# Patient Record
Sex: Female | Born: 1990 | Race: Black or African American | Hispanic: No | Marital: Single | Smoking: Former smoker
Health system: Southern US, Community
[De-identification: ages and names within clinical notes are randomized; demographics above are authoritative.]

## PROBLEM LIST (undated history)

## (undated) DIAGNOSIS — O139 Gestational [pregnancy-induced] hypertension without significant proteinuria, unspecified trimester: Secondary | ICD-10-CM

## (undated) DIAGNOSIS — I10 Essential (primary) hypertension: Secondary | ICD-10-CM

## (undated) HISTORY — PX: NO PAST SURGERIES: SHX2092

## (undated) HISTORY — PX: OTHER SURGICAL HISTORY: SHX169

---
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
## 2011-01-26 LAB — POCT PREGNANCY, URINE: Preg Test, Ur: NEGATIVE

## 2011-05-31 IMAGING — US US OB DETAIL+14 WK
2 of 3 series · 14 of 28 positions shown · non-contrast
Comparison: none

OBSTETRICAL ULTRASOUND:
 This ultrasound exam was performed in the [HOSPITAL] Ultrasound Department.  The OB US report was generated in the AS system, and faxed to the ordering physician.  This report is also available in [REDACTED] PACS.

[Series 1: us ob detail +14 wk · 0.20mm/px · 77 acquisitions, 13 frames shown (1 of 2)]
[im 4/77]
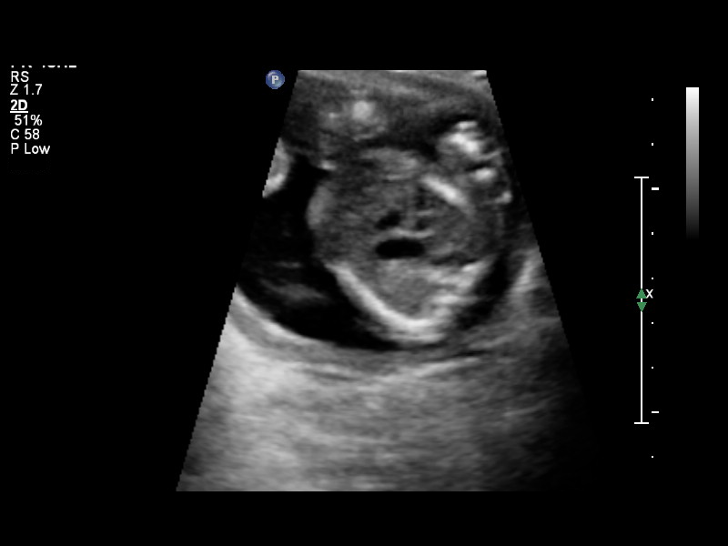
[im 10/77]
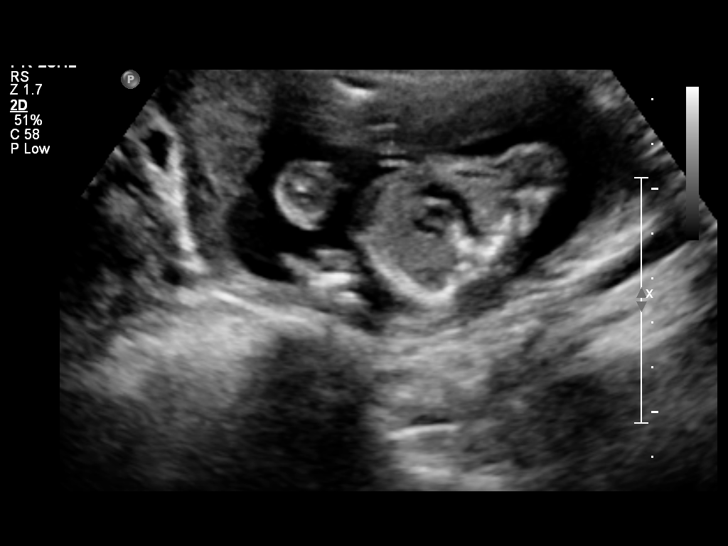
[im 16/77]
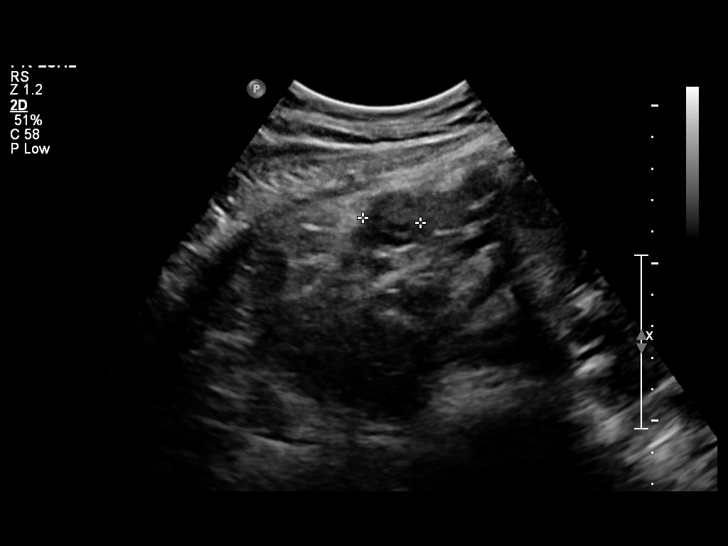
[im 22/77]
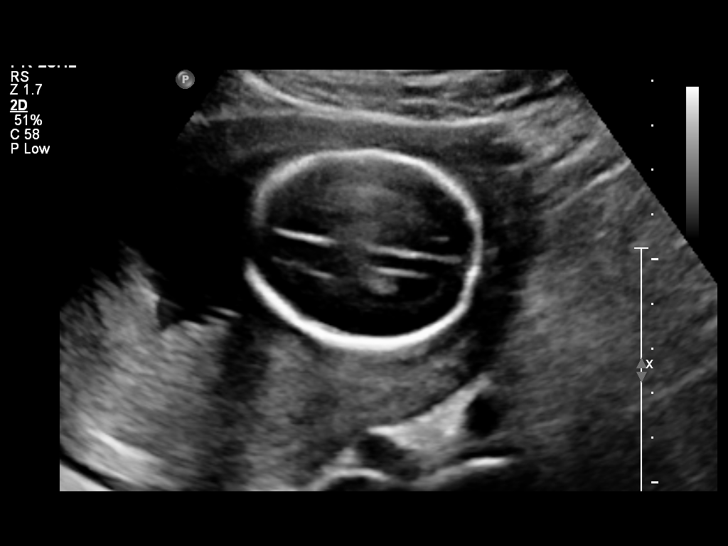
[im 28/77]
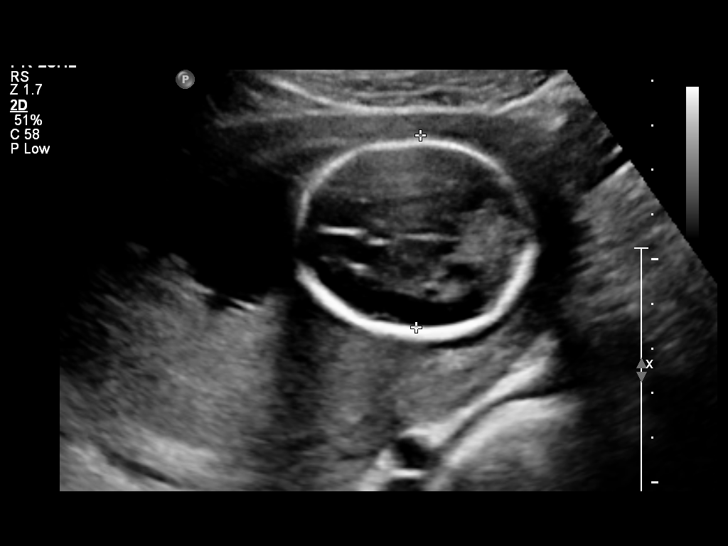
[im 34/77]
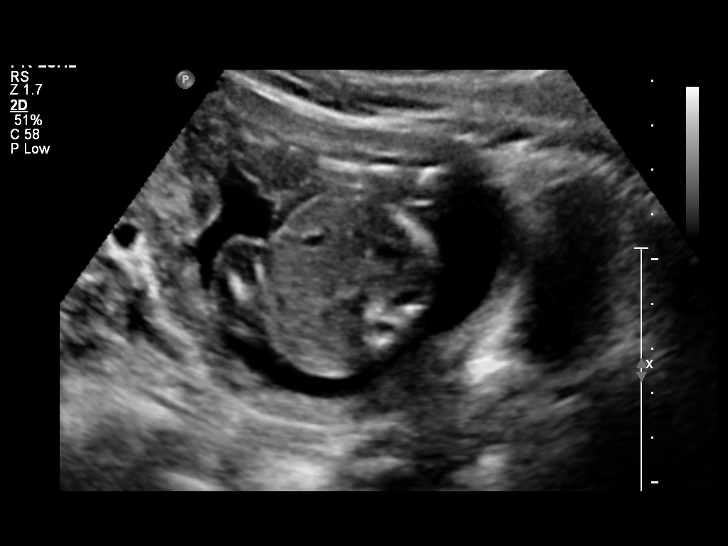
[im 40/77]
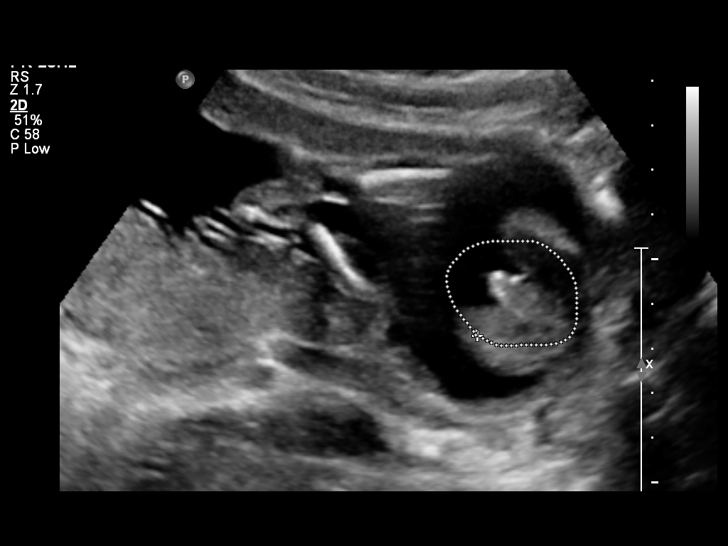
[im 46/77]
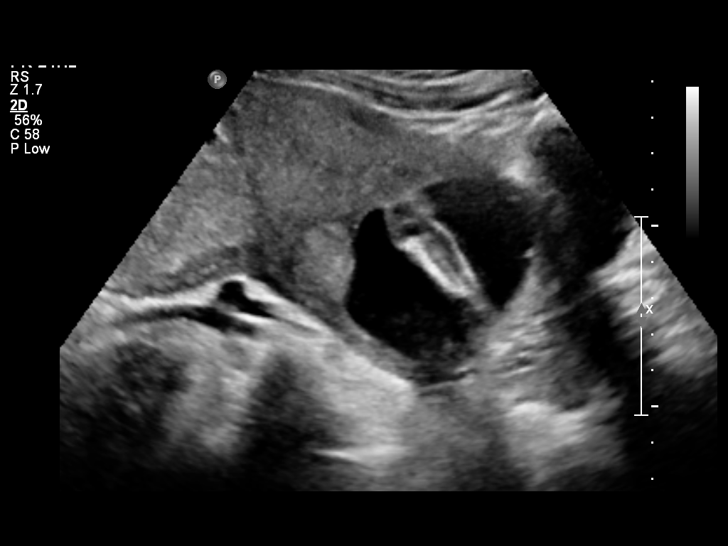
[im 52/77]
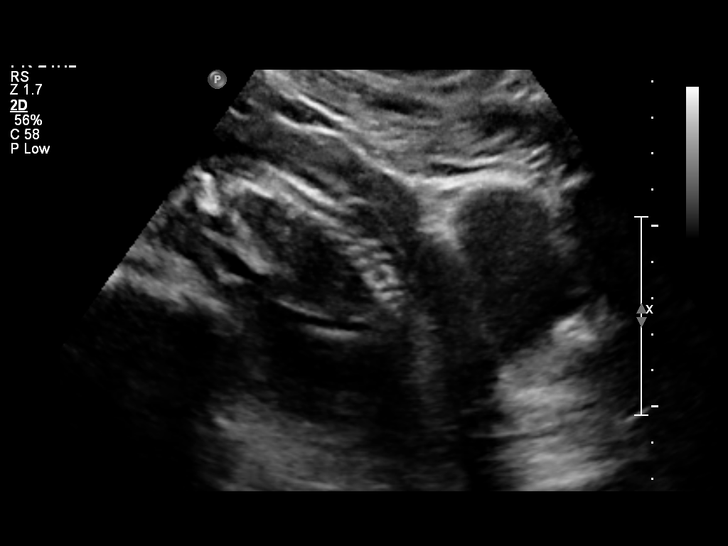
[im 58/77]
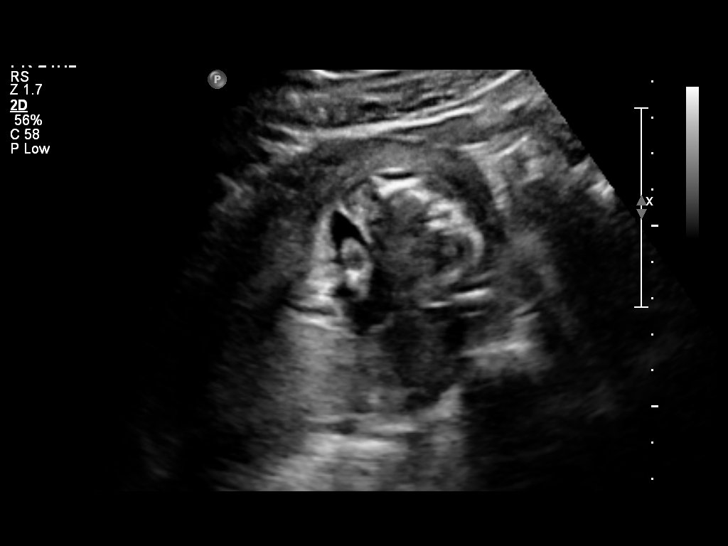
[im 64/77]
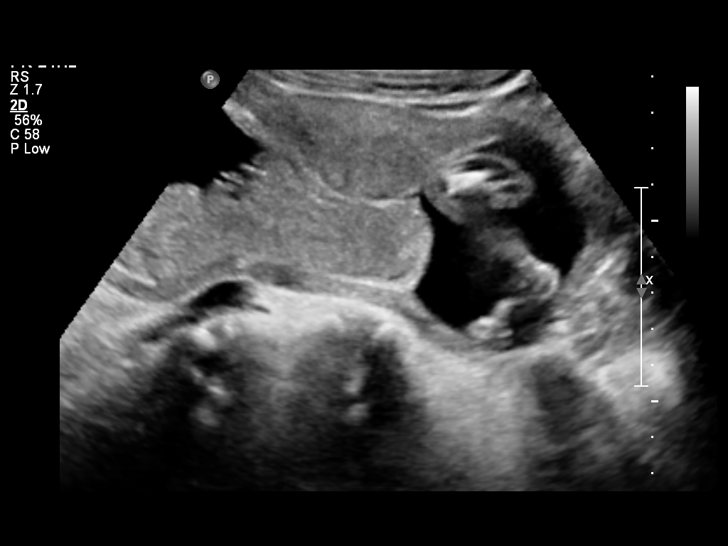
[im 70/77]
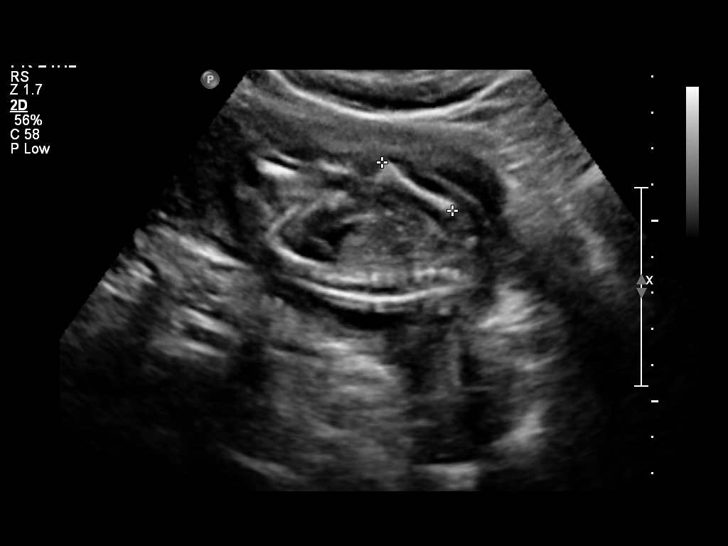
[im 77/77]
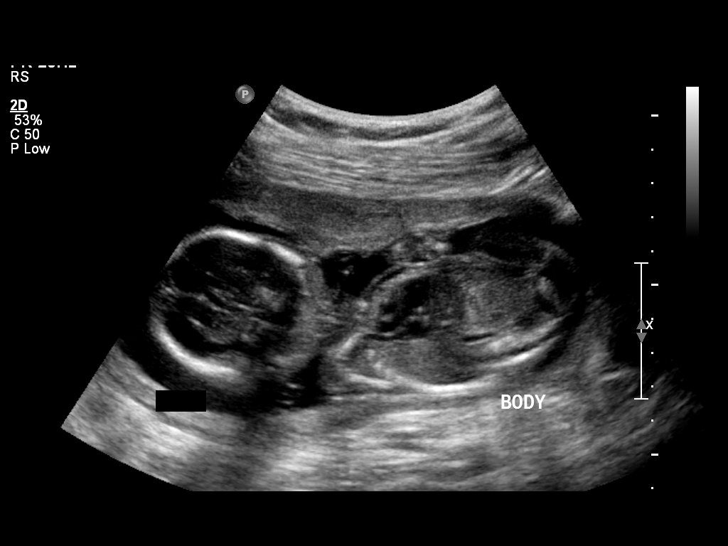

[Series 1: us ob detail +14 wk · 0.17mm/px · 1 of 4 slices shown (2 of 2)]
[im 1/4]
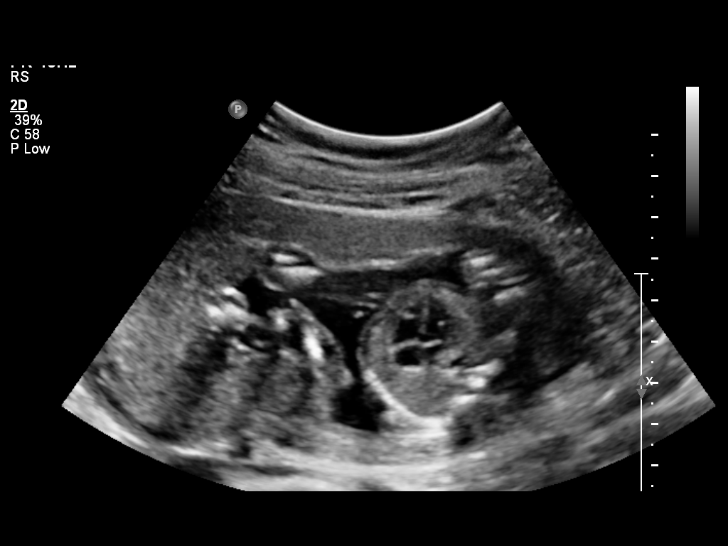

[14 of 28 positions shown; findings below may reference images not displayed]

IMPRESSION: See AS Obstetric US report.

## 2012-01-12 ENCOUNTER — Encounter (HOSPITAL_COMMUNITY): Payer: Self-pay | Admitting: Emergency Medicine

## 2012-01-12 ENCOUNTER — Emergency Department (HOSPITAL_COMMUNITY)
Admission: EM | Admit: 2012-01-12 | Discharge: 2012-01-13 | Disposition: A | Discharge status: Home / Self Care | Payer: No Typology Code available for payment source | Attending: Emergency Medicine | Admitting: Emergency Medicine

## 2012-01-12 DIAGNOSIS — Y92410 Unspecified street and highway as the place of occurrence of the external cause: Secondary | ICD-10-CM | POA: Insufficient documentation

## 2012-01-12 DIAGNOSIS — IMO0002 Reserved for concepts with insufficient information to code with codable children: Secondary | ICD-10-CM

## 2012-01-12 DIAGNOSIS — Z23 Encounter for immunization: Secondary | ICD-10-CM | POA: Insufficient documentation

## 2012-01-12 DIAGNOSIS — M542 Cervicalgia: Secondary | ICD-10-CM | POA: Insufficient documentation

## 2012-01-12 DIAGNOSIS — R079 Chest pain, unspecified: Secondary | ICD-10-CM | POA: Insufficient documentation

## 2012-01-12 DIAGNOSIS — S81009A Unspecified open wound, unspecified knee, initial encounter: Secondary | ICD-10-CM | POA: Insufficient documentation

## 2012-01-12 HISTORY — DX: Essential (primary) hypertension: I10
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
## 2012-01-13 ENCOUNTER — Emergency Department (HOSPITAL_COMMUNITY): Payer: No Typology Code available for payment source

## 2012-01-13 MED ORDER — IBUPROFEN 800 MG PO TABS
800.0000 mg | ORAL_TABLET | Freq: Once | ORAL | Status: AC
  Administered 2012-01-13: 800 mg via ORAL
  Filled 2012-01-13: qty 1

## 2012-01-13 MED ORDER — OXYCODONE-ACETAMINOPHEN 5-325 MG PO TABS: 1.0000 | ORAL_TABLET | Freq: Four times a day (QID) | ORAL | Status: DC | PRN

## 2012-01-13 MED ORDER — TETANUS-DIPHTHERIA TOXOIDS TD 5-2 LFU IM INJ
0.5000 mL | INJECTION | Freq: Once | INTRAMUSCULAR | Status: AC
  Administered 2012-01-13: 0.5 mL via INTRAMUSCULAR
  Filled 2012-01-13: qty 0.5

## 2012-01-13 MED ORDER — OXYCODONE-ACETAMINOPHEN 5-325 MG PO TABS
2.0000 | ORAL_TABLET | Freq: Once | ORAL | Status: AC
  Administered 2012-01-13: 2 via ORAL
  Filled 2012-01-13: qty 2

## 2012-01-13 MED ORDER — DIAZEPAM 5 MG PO TABS: 5.0000 mg | ORAL_TABLET | Freq: Two times a day (BID) | ORAL | Status: DC
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
## 2012-01-13 NOTE — ED Provider Notes (Signed)
Medical screening examination/treatment/procedure(s) were conducted as a shared visit with non-physician practitioner(s) and myself.  I personally evaluated the patient during the encounter.  Laceration repair and discharge completed by Darius Bump, MD 01/13/12 [phone]

## 2012-01-16 ENCOUNTER — Emergency Department (HOSPITAL_COMMUNITY)
Admission: EM | Admit: 2012-01-16 | Discharge: 2012-01-16 | Disposition: A | Discharge status: Home / Self Care | Payer: No Typology Code available for payment source | Attending: Emergency Medicine | Admitting: Emergency Medicine

## 2012-01-16 ENCOUNTER — Encounter (HOSPITAL_COMMUNITY): Payer: Self-pay | Admitting: Adult Health

## 2012-01-16 DIAGNOSIS — Z87891 Personal history of nicotine dependence: Secondary | ICD-10-CM | POA: Insufficient documentation

## 2012-01-16 DIAGNOSIS — X58XXXA Exposure to other specified factors, initial encounter: Secondary | ICD-10-CM | POA: Insufficient documentation

## 2012-01-16 DIAGNOSIS — T148XXA Other injury of unspecified body region, initial encounter: Secondary | ICD-10-CM | POA: Insufficient documentation

## 2012-01-16 DIAGNOSIS — L089 Local infection of the skin and subcutaneous tissue, unspecified: Secondary | ICD-10-CM | POA: Insufficient documentation

## 2012-01-16 DIAGNOSIS — I10 Essential (primary) hypertension: Secondary | ICD-10-CM | POA: Insufficient documentation

## 2012-01-16 MED ORDER — CEPHALEXIN 500 MG PO CAPS: 500.0000 mg | ORAL_CAPSULE | Freq: Four times a day (QID) | ORAL | Status: DC
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
## 2012-01-16 NOTE — ED Notes (Signed)
Stitches in left lower leg with serous sanguinous drainage.  Pt concerned wound is infected, no redness around site.

## 2012-01-17 NOTE — ED Provider Notes (Signed)
Medical screening examination/treatment/procedure(s) were performed by non-physician practitioner and as supervising physician I was immediately available for consultation/collaboration.   Nannie Starzyk, MD 01/17/12 0040 

## 2012-07-29 ENCOUNTER — Inpatient Hospital Stay (HOSPITAL_COMMUNITY): Payer: Medicaid Other | Admitting: Anesthesiology

## 2012-07-29 ENCOUNTER — Encounter (HOSPITAL_COMMUNITY): Payer: Self-pay | Admitting: Anesthesiology

## 2012-07-29 ENCOUNTER — Inpatient Hospital Stay (HOSPITAL_COMMUNITY): Payer: Medicaid Other

## 2012-07-29 ENCOUNTER — Inpatient Hospital Stay (HOSPITAL_COMMUNITY)
Admission: AD | Admit: 2012-07-29 | Discharge: 2012-08-01 | DRG: 765 | Disposition: A | Discharge status: Home / Self Care | Payer: Medicaid Other | Source: Ambulatory Visit | Attending: Obstetrics and Gynecology | Admitting: Obstetrics and Gynecology

## 2012-07-29 ENCOUNTER — Encounter (HOSPITAL_COMMUNITY): Payer: Self-pay

## 2012-07-29 ENCOUNTER — Encounter (HOSPITAL_COMMUNITY)
Admission: AD | Disposition: A | Discharge status: Home / Self Care | Payer: Self-pay | Source: Ambulatory Visit | Attending: Obstetrics and Gynecology

## 2012-07-29 DIAGNOSIS — O09299 Supervision of pregnancy with other poor reproductive or obstetric history, unspecified trimester: Secondary | ICD-10-CM

## 2012-07-29 DIAGNOSIS — O4590 Premature separation of placenta, unspecified, unspecified trimester: Secondary | ICD-10-CM

## 2012-07-29 DIAGNOSIS — O368390 Maternal care for abnormalities of the fetal heart rate or rhythm, unspecified trimester, not applicable or unspecified: Secondary | ICD-10-CM

## 2012-07-29 DIAGNOSIS — O0930 Supervision of pregnancy with insufficient antenatal care, unspecified trimester: Secondary | ICD-10-CM

## 2012-07-29 DIAGNOSIS — Z98891 History of uterine scar from previous surgery: Secondary | ICD-10-CM

## 2012-07-29 HISTORY — DX: Gestational (pregnancy-induced) hypertension without significant proteinuria, unspecified trimester: O13.9

## 2012-07-29 LAB — CBC
HCT: 29.9 % — ABNORMAL LOW (ref 36.0–46.0)
Hemoglobin: 10.3 g/dL — ABNORMAL LOW (ref 12.0–15.0)
MCHC: 34.4 g/dL (ref 30.0–36.0)

## 2012-07-29 SURGERY — Surgical Case
Anesthesia: General | Site: Abdomen | Wound class: Clean Contaminated

## 2012-07-29 MED ORDER — CEFAZOLIN SODIUM-DEXTROSE 2-3 GM-% IV SOLR
2.0000 g | Freq: Three times a day (TID) | INTRAVENOUS | Status: AC
  Administered 2012-07-29: 2 g via INTRAVENOUS
  Filled 2012-07-29: qty 50

## 2012-07-29 MED ORDER — BETAMETHASONE SOD PHOS & ACET 6 (3-3) MG/ML IJ SUSP
12.0000 mg | Freq: Once | INTRAMUSCULAR | Status: AC
  Administered 2012-07-29: 12 mg via INTRAMUSCULAR
  Filled 2012-07-29: qty 2

## 2012-07-29 SURGICAL SUPPLY — 27 items
CONTAINER PREFILL 10% NBF 15ML (MISCELLANEOUS) IMPLANT
DRAPE LG THREE QUARTER DISP (DRAPES) ×2 IMPLANT
DRSG OPSITE 6X11 MED (GAUZE/BANDAGES/DRESSINGS) ×2 IMPLANT
DRSG OPSITE POSTOP 4X10 (GAUZE/BANDAGES/DRESSINGS) ×2 IMPLANT
DURAPREP 26ML APPLICATOR (WOUND CARE) ×2 IMPLANT
ELECT REM PT RETURN 9FT ADLT (ELECTROSURGICAL) ×2
ELECTRODE REM PT RTRN 9FT ADLT (ELECTROSURGICAL) ×1 IMPLANT
EXTRACTOR VACUUM M CUP 4 TUBE (SUCTIONS) IMPLANT
GLOVE BIOGEL PI IND STRL 6.5 (GLOVE) ×1 IMPLANT
GLOVE BIOGEL PI INDICATOR 6.5 (GLOVE) ×1
GLOVE SURG SS PI 6.0 STRL IVOR (GLOVE) ×2 IMPLANT
GOWN STRL REIN XL XLG (GOWN DISPOSABLE) ×4 IMPLANT
KIT ABG SYR 3ML LUER SLIP (SYRINGE) IMPLANT
NEEDLE HYPO 25X5/8 SAFETYGLIDE (NEEDLE) IMPLANT
NS IRRIG 1000ML POUR BTL (IV SOLUTION) ×2 IMPLANT
PACK C SECTION WH (CUSTOM PROCEDURE TRAY) ×2 IMPLANT
PAD OB MATERNITY 4.3X12.25 (PERSONAL CARE ITEMS) ×2 IMPLANT
RTRCTR C-SECT PINK 25CM LRG (MISCELLANEOUS) IMPLANT
SEPRAFILM MEMBRANE 5X6 (MISCELLANEOUS) IMPLANT
SLEEVE SCD COMPRESS KNEE MED (MISCELLANEOUS) IMPLANT
STAPLER VISISTAT 35W (STAPLE) ×2 IMPLANT
SUT PLAIN 0 NONE (SUTURE) IMPLANT
SUT VIC AB 0 CT1 36 (SUTURE) ×8 IMPLANT
SUT VIC AB 4-0 KS 27 (SUTURE) IMPLANT
TOWEL OR 17X24 6PK STRL BLUE (TOWEL DISPOSABLE) ×6 IMPLANT
TRAY FOLEY CATH 14FR (SET/KITS/TRAYS/PACK) ×2 IMPLANT
WATER STERILE IRR 1000ML POUR (IV SOLUTION) ×2 IMPLANT

## 2012-07-29 NOTE — MAU Provider Note (Signed)
Pt brought to OR. Unable to find fetal heart tones. BMZ given while on OR table.

## 2012-07-30 ENCOUNTER — Encounter (HOSPITAL_COMMUNITY): Payer: Self-pay | Admitting: *Deleted

## 2012-07-30 LAB — RAPID URINE DRUG SCREEN, HOSP PERFORMED
Benzodiazepines: POSITIVE — AB
Cocaine: NOT DETECTED

## 2012-07-30 LAB — HEPATITIS B SURFACE ANTIGEN: Hepatitis B Surface Ag: NEGATIVE

## 2012-07-30 LAB — CBC
Hemoglobin: 8.6 g/dL — ABNORMAL LOW (ref 12.0–15.0)
MCHC: 34.1 g/dL (ref 30.0–36.0)
RDW: 13.8 % (ref 11.5–15.5)
WBC: 27.6 10*3/uL — ABNORMAL HIGH (ref 4.0–10.5)

## 2012-07-30 LAB — TYPE AND SCREEN
ABO/RH(D): A POS
Antibody Screen: NEGATIVE

## 2012-07-30 LAB — ABO/RH: ABO/RH(D): A POS

## 2012-07-30 LAB — RAPID HIV SCREEN (WH-MAU): Rapid HIV Screen: NONREACTIVE

## 2012-07-30 MED ORDER — SIMETHICONE 80 MG PO CHEW: 80.0000 mg | CHEWABLE_TABLET | ORAL | Status: DC | PRN

## 2012-07-30 MED ORDER — HYDROMORPHONE HCL PF 1 MG/ML IJ SOLN
0.2500 mg | INTRAMUSCULAR | Status: DC | PRN
  Administered 2012-07-30 (×2): 0.5 mg via INTRAVENOUS

## 2012-07-30 MED ORDER — MENTHOL 3 MG MT LOZG: 1.0000 | LOZENGE | OROMUCOSAL | Status: DC | PRN

## 2012-07-30 MED ORDER — SODIUM CHLORIDE 0.9 % IJ SOLN: 9.0000 mL | INTRAMUSCULAR | Status: DC | PRN

## 2012-07-30 MED ORDER — FENTANYL CITRATE 0.05 MG/ML IJ SOLN
INTRAMUSCULAR | Status: DC | PRN
  Administered 2012-07-30: 250 ug via INTRAVENOUS
  Administered 2012-07-30: 100 ug via INTRAVENOUS

## 2012-07-30 MED ORDER — LACTATED RINGERS IV SOLN: INTRAVENOUS | Status: DC

## 2012-07-30 MED ORDER — DIBUCAINE 1 % RE OINT: 1.0000 "application " | TOPICAL_OINTMENT | RECTAL | Status: DC | PRN

## 2012-07-30 MED ORDER — LACTATED RINGERS IV SOLN
INTRAVENOUS | Status: DC | PRN
  Administered 2012-07-29: via INTRAVENOUS

## 2012-07-30 MED ORDER — SUCCINYLCHOLINE CHLORIDE 20 MG/ML IJ SOLN
INTRAMUSCULAR | Status: DC | PRN
  Administered 2012-07-29: 120 mg via INTRAVENOUS

## 2012-07-30 MED ORDER — WITCH HAZEL-GLYCERIN EX PADS: 1.0000 "application " | MEDICATED_PAD | CUTANEOUS | Status: DC | PRN

## 2012-07-30 MED ORDER — LANOLIN HYDROUS EX OINT: 1.0000 "application " | TOPICAL_OINTMENT | CUTANEOUS | Status: DC | PRN

## 2012-07-30 MED ORDER — METOCLOPRAMIDE HCL 5 MG/ML IJ SOLN
INTRAMUSCULAR | Status: DC | PRN
  Administered 2012-07-30: 10 mg via INTRAVENOUS

## 2012-07-30 MED ORDER — ONDANSETRON HCL 4 MG/2ML IJ SOLN: 4.0000 mg | Freq: Four times a day (QID) | INTRAMUSCULAR | Status: DC | PRN

## 2012-07-30 MED ORDER — TETANUS-DIPHTH-ACELL PERTUSSIS 5-2.5-18.5 LF-MCG/0.5 IM SUSP
0.5000 mL | Freq: Once | INTRAMUSCULAR | Status: AC
  Administered 2012-07-31: 0.5 mL via INTRAMUSCULAR
  Filled 2012-07-30: qty 0.5

## 2012-07-30 MED ORDER — METOCLOPRAMIDE HCL 5 MG/ML IJ SOLN: 10.0000 mg | Freq: Once | INTRAMUSCULAR | Status: DC | PRN

## 2012-07-30 MED ORDER — OXYTOCIN 10 UNIT/ML IJ SOLN
40.0000 [IU] | INTRAVENOUS | Status: DC | PRN
  Administered 2012-07-29: 40 [IU] via INTRAVENOUS

## 2012-07-30 MED ORDER — PROPOFOL 10 MG/ML IV BOLUS
INTRAVENOUS | Status: DC | PRN
  Administered 2012-07-29: 200 mg via INTRAVENOUS

## 2012-07-30 MED ORDER — OXYTOCIN 40 UNITS IN LACTATED RINGERS INFUSION - SIMPLE MED: 62.5000 mL/h | INTRAVENOUS | Status: AC

## 2012-07-30 MED ORDER — FERROUS SULFATE 325 (65 FE) MG PO TABS
325.0000 mg | ORAL_TABLET | Freq: Two times a day (BID) | ORAL | Status: DC
  Administered 2012-07-30 – 2012-08-01 (×5): 325 mg via ORAL
  Filled 2012-07-30 (×5): qty 1

## 2012-07-30 MED ORDER — NALOXONE HCL 0.4 MG/ML IJ SOLN: 0.4000 mg | INTRAMUSCULAR | Status: DC | PRN

## 2012-07-30 MED ORDER — KETOROLAC TROMETHAMINE 30 MG/ML IJ SOLN
15.0000 mg | Freq: Once | INTRAMUSCULAR | Status: AC | PRN
  Administered 2012-07-30: 30 mg via INTRAVENOUS

## 2012-07-30 MED ORDER — PRENATAL MULTIVITAMIN CH
1.0000 | ORAL_TABLET | Freq: Every day | ORAL | Status: DC
  Administered 2012-07-30 – 2012-08-01 (×3): 1 via ORAL
  Filled 2012-07-30 (×3): qty 1

## 2012-07-30 MED ORDER — DIPHENHYDRAMINE HCL 12.5 MG/5ML PO ELIX: 12.5000 mg | ORAL_SOLUTION | Freq: Four times a day (QID) | ORAL | Status: DC | PRN

## 2012-07-30 MED ORDER — DIPHENHYDRAMINE HCL 50 MG/ML IJ SOLN: 12.5000 mg | Freq: Four times a day (QID) | INTRAMUSCULAR | Status: DC | PRN

## 2012-07-30 MED ORDER — OXYCODONE-ACETAMINOPHEN 5-325 MG PO TABS
1.0000 | ORAL_TABLET | ORAL | Status: DC | PRN
  Administered 2012-07-30: 1 via ORAL
  Administered 2012-07-31 (×2): 2 via ORAL
  Filled 2012-07-30: qty 2
  Filled 2012-07-30: qty 1
  Filled 2012-07-30: qty 2

## 2012-07-30 MED ORDER — HYDROMORPHONE 0.3 MG/ML IV SOLN
INTRAVENOUS | Status: DC
  Administered 2012-07-30: 03:00:00 via INTRAVENOUS
  Administered 2012-07-30: 2.7 mg via INTRAVENOUS
  Filled 2012-07-30: qty 25

## 2012-07-30 MED ORDER — SENNOSIDES-DOCUSATE SODIUM 8.6-50 MG PO TABS
2.0000 | ORAL_TABLET | Freq: Every day | ORAL | Status: DC
  Administered 2012-07-30 – 2012-07-31 (×2): 2 via ORAL

## 2012-07-30 MED ORDER — PROPOFOL 10 MG/ML IV BOLUS: INTRAVENOUS | Status: DC | PRN

## 2012-07-30 MED ORDER — SIMETHICONE 80 MG PO CHEW
80.0000 mg | CHEWABLE_TABLET | Freq: Three times a day (TID) | ORAL | Status: DC
  Administered 2012-07-30 – 2012-08-01 (×9): 80 mg via ORAL

## 2012-07-30 MED ORDER — MIDAZOLAM HCL 5 MG/5ML IJ SOLN
INTRAMUSCULAR | Status: DC | PRN
  Administered 2012-07-30: 2 mg via INTRAVENOUS

## 2012-07-30 MED ORDER — DEXAMETHASONE SODIUM PHOSPHATE 4 MG/ML IJ SOLN
INTRAMUSCULAR | Status: DC | PRN
  Administered 2012-07-30: 10 mg via INTRAVENOUS

## 2012-07-30 MED ORDER — DIPHENHYDRAMINE HCL 25 MG PO CAPS: 25.0000 mg | ORAL_CAPSULE | Freq: Four times a day (QID) | ORAL | Status: DC | PRN

## 2012-07-30 MED ORDER — LACTATED RINGERS IV SOLN
INTRAVENOUS | Status: DC | PRN
  Administered 2012-07-29 – 2012-07-30 (×3): via INTRAVENOUS

## 2012-07-30 MED ORDER — ONDANSETRON HCL 4 MG/2ML IJ SOLN
INTRAMUSCULAR | Status: DC | PRN
  Administered 2012-07-30: 4 mg via INTRAVENOUS

## 2012-07-30 NOTE — Progress Notes (Signed)
Subjective: Postpartum Day 1: Cesarean Delivery Patient reports pain is well controlled on PCA. Ambulating. Foley in place  Objective: Vital signs in last 24 hours: Temp:  [97.7 F (36.5 C)-98.6 F (37 C)] 98.1 F (36.7 C) (04/09 0530) Pulse Rate:  [84-109] 84 (04/09 0530) Resp:  [16-22] 18 (04/09 0542) BP: (114-148)/(72-93) 116/74 mmHg (04/09 0530) SpO2:  [95 %-100 %] 100 % (04/09 0542) Weight:  [99.791 kg (220 lb)] 99.791 kg (220 lb) (04/09 0300)  Physical Exam:  General: alert, cooperative and appears stated age Lochia: appropriate Uterine Fundus: firm Incision: no significant drainage DVT Evaluation: No evidence of DVT seen on physical exam.   Recent Labs  07/29/12 2337 07/30/12 0545  HGB 10.3* 8.6*  HCT 29.9* 25.2*    Assessment/Plan: Status post Cesarean section. Doing well postoperatively.  Continue current care. - Anemia at baseline worsened secondary to blood loss from C-section. Starting FeSO4 - DC PCA today and transition to PO Percocet - Foley out today  - Plans to pump breast milk - Undecided on contraception - Baby in NICU - social work consult - UDS  MERRELL, DAVID, MD Family Medicine PGY2 07/30/2012, 7:25 AM

## 2012-07-30 NOTE — Anesthesia Postprocedure Evaluation (Signed)
  Anesthesia Post-op Note  Patient: [name]  Procedure(s) Performed: Procedure(s): CESAREAN SECTION (N/A)  Patient Location: PACU and Women's Unit  Anesthesia Type:Epidural  Level of Consciousness: awake, alert , oriented and patient cooperative  Airway and Oxygen Therapy: Patient Spontanous Breathing  Post-op Pain: mild  Post-op Assessment: Post-op Vital signs reviewed, Patient's Cardiovascular Status Stable and Respiratory Function Stable  Post-op Vital Signs: Reviewed and stable  Complications: No apparent anesthesia complications
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
## 2012-07-30 NOTE — Progress Notes (Signed)
I have seen and examined this patient and I agree with the above. Cam Hai 8:40 AM 07/30/2012

## 2012-07-30 NOTE — Transfer of Care (Signed)
Immediate Anesthesia Transfer of Care Note  Patient: [name]  Procedure(s) Performed: Procedure(s): CESAREAN SECTION (N/A)  Patient Location: PACU  Anesthesia Type:General  Level of Consciousness: sedated  Airway & Oxygen Therapy: Patient Spontanous Breathing and Patient connected to face mask oxygen  Post-op Assessment: Report given to PACU RN and Post -op Vital signs reviewed and stable  Post vital signs: stable  Complications: No apparent anesthesia complications

## 2012-07-30 NOTE — Op Note (Addendum)
[name] PROCEDURE DATE: 07/29/2012 - 07/30/2012  PREOPERATIVE DIAGNOSIS: Intrauterine pregnancy at  [redacted]w[redacted]d weeks gestation; abruptio placenta and non-reassuring fetal status  POSTOPERATIVE DIAGNOSIS: The same  PROCEDURE:     Cesarean Section  SURGEON:  Dr. Catalina Antigua  ASSISTANT: none  INDICATIONS: [name] is a 22 y.o. [id number] at [redacted]w[redacted]d scheduled for cesarean section secondary to abruptio placenta and non-reassuring fetal status.  The risks of cesarean section discussed with the patient included but were not limited to: bleeding which may require transfusion or reoperation; infection which may require antibiotics; injury to bowel, bladder, ureters or other surrounding organs; injury to the fetus; need for additional procedures including hysterectomy in the event of a life-threatening hemorrhage; placental abnormalities wth subsequent pregnancies, incisional problems, thromboembolic phenomenon and other postoperative/anesthesia complications. The patient concurred with the proposed plan, giving informed written consent for the procedure.    FINDINGS:  Viable female infant in cephalic presentation.  Apgars not available at time of this note, weight, 3 pounds and 6 ounces, cord pH 6.72.  Clear amniotic fluid.  Spontaneous delivery of placental with numerous clots, three vessel cord with the presence of a true knot.  Normal uterus, fallopian tubes and ovaries bilaterally.  ANESTHESIA:    Spinal INTRAVENOUS FLUIDS:1200 ml ESTIMATED BLOOD LOSS: 1500 ml URINE OUTPUT:  50 ml SPECIMENS: Placenta sent to pathology COMPLICATIONS: None immediate  PROCEDURE IN DETAIL:  The patient received intravenous antibiotics and had sequential compression devices applied to her lower extremities while in the preoperative area.  She was then taken to the operating room where anesthesia was induced and was found to be adequate. A foley catheter was placed into her bladder and attached to Gavriel Holzhauer gravity. She was  then placed in a dorsal supine position with a leftward tilt, and prepped and draped in a sterile manner. After an adequate timeout was performed, a Pfannenstiel skin incision was made with scalpel and carried through to the underlying layer of fascia. The fascia was incised in the midline and this incision was extended bilaterally using the Mayo scissors. Kocher clamps were applied to the superior aspect of the fascial incision and the underlying rectus muscles were dissected off bluntly. A similar process was carried out on the inferior aspect of the facial incision. The rectus muscles were separated in the midline bluntly and the peritoneum was entered bluntly. The Alexis self-retaining retractor was introduced into the abdominal cavity. Attention was turned to the lower uterine segment where a bladder flap was created, and a transverse hysterotomy was made with a scalpel and extended bilaterally bluntly. The infant was successfully delivered, and cord was clamped and cut and infant was handed over to awaiting neonatology team. Uterine massage was then administered and the placenta delivered intact with three-vessel cord. The uterus was cleared of clot and debris.  The hysterotomy was closed with 0 Vicryl in a running locked fashion, and an imbricating layer was also placed with a 0 Vicryl. Overall, excellent hemostasis was noted. The pelvis copiously irrigated and cleared of all clot and debris. Hemostasis was confirmed on all surfaces.  The peritoneum and the muscles were reapproximated using 0 vicryl interrupted stitches. The fascia was then closed using 0 Vicryl in a running locked fashion.  The subcutaneous layer was reapproximated with plain gut and the skin was closed in a subcuticular fashion using 3.0 Vicryl. The patient tolerated the procedure well. Sponge, lap, instrument and needle counts were correct x 2. She was taken to the recovery room in stable  condition.    [name],[name]  07/30/2012 12:32  AM

## 2012-07-30 NOTE — Progress Notes (Signed)
Ur chart review completed.  

## 2012-07-30 NOTE — Anesthesia Postprocedure Evaluation (Signed)
  Anesthesia Post-op Note  Anesthesia Post Note  Patient: [name]  Procedure(s) Performed: Procedure(s) (LRB): CESAREAN SECTION (N/A)  Anesthesia type: General  Patient location: PACU  Post pain: Pain level controlled  Post assessment: Post-op Vital signs reviewed  Last Vitals:  Filed Vitals:   07/30/12 0115  BP: 132/78  Pulse: 89  Temp:   Resp: 16    Post vital signs: Reviewed  Level of consciousness: sedated  Complications: No apparent anesthesia complications

## 2012-07-30 NOTE — Anesthesia Preprocedure Evaluation (Addendum)
Anesthesia Evaluation  Patient identified by MRN, date of birth, ID band Patient awake    Reviewed: Allergy & Precautions, H&P , NPO status , reviewed documented beta blocker date and time   History of Anesthesia Complications Negative for: history of anesthetic complications  Airway       Dental   Pulmonary former smoker,          Cardiovascular hypertension (PIH in prior pregnancy (no PNC in this pregnancy)),     Neuro/Psych negative neurological ROS  negative psych ROS   GI/Hepatic negative GI ROS, Neg liver ROS,   Endo/Other  obese  Renal/GU negative Renal ROS     Musculoskeletal   Abdominal   Peds  Hematology  (+) anemia ,   Anesthesia Other Findings Partial history obtained - emergency  Ate lunch at 4 pm, vomited shortly thereafter  Reproductive/Obstetrics (+) Pregnancy (h/o IUFD at 7 months, no PNC in this pregnancy - abruption with fetal bradycardia for STAT C/S)                          Anesthesia Physical Anesthesia Plan  ASA: III and emergent  Anesthesia Plan: General ETT, Rapid Sequence and Cricoid Pressure   Post-op Pain Management:    Induction:   Airway Management Planned:   Additional Equipment:   Intra-op Plan:   Post-operative Plan:   Informed Consent: I have reviewed the patients History and Physical, chart, labs and discussed the procedure including the risks, benefits and alternatives for the proposed anesthesia with the patient or authorized representative who has indicated his/her understanding and acceptance.   Only emergency history available  Plan Discussed with: CRNA and Surgeon  Anesthesia Plan Comments:         Anesthesia Quick Evaluation

## 2012-07-30 NOTE — Progress Notes (Signed)
Patient was seen in MAU prior to cesarean section secondary to fetal bradycardia. Patient did not have any prenatal care as she was waiting for her Medicaid to become active. She denies any PMHx and PSHx. Patient had a previous IUFD at 7 months secondary to ? HTN. Bedside ultrasound demonstrates evidence of placenta abruption and fetal heart rate of 65. Patient was consented for emergency primary cesarean section. Risks, benefits and alternatives were explained including but not limited to risks of bleeding, infection and damage to adjacent organs. Patient verbalized understanding. All questions were answered. Patient received one dose of betamethasone in route to OR. Due to eminent delivery, a second dose cannot be administered.

## 2012-07-30 NOTE — Consult Note (Signed)
[facility]  Delivery Note: C-section 07/30/2012 1:16 AM  I was called to the operating room at the request of the patient's obstetrician (Dr. Jolayne Panther) due to fetal bradycardia in estimated 30-week pregnancy.  PRENATAL HX: None. Mom presented tonight to MAU. Based on last menstrual period, estimated the pregnancy at 30 5/7 weeks. Had ultrasound that showed fetal heart rate about 70 bpm along with findings suggestive of placental abruption. Mom sent to OR for STAT c/section.  DELIVERY: General anesthesia. Stat c/section with vertex delivery. The newborn appeared lifeless when placed on warmer bed. No HR detected, and baby wasn't breathing or moving. We quickly suctioned the mouth and nose with a bulb syringe, then began PPV by bag/mask. After about 30 seconds, HR not detected so chest compressions were begun by NNP. At 1 minute, Apgar was 0. NNP began preparing for UVC insertion while RT took over chest compressions. Baby was intubated by me with a 3.0 ETT at 1-2 minutes of age. Second RT helped secure the ETT. At 3 minutes, slow HR was detected. We continued to do chest compressions and bag/mask, but by 5 minutes the HR was over 100 bpm (Apgar was 2). Although nursing staff drew up epinephrine while UVC was being inserted, the HR rose before it was needed. Chest compressions were stopped but manual ventilations continued due to lack of respiratory effort. UVC was successfully placed (to about 5 cm), then at 7 minutes the baby was given 10 ml normal saline intravenously. 10-minute Apgar was unchanged at 2. We transferred the baby to a transport isolette, then took her with the grandmother to the NICU for further care.  _____________________  Electronically Signed By:  Angelita Ingles, MD  Neonatologist

## 2012-07-30 NOTE — MAU Note (Signed)
Pt entered room approximately 2334, arrive via EMS. States constant abdominal pain x 1-2 hours. Denies leaking of fluid or vaginal bleeding. Unable to find fetal heart tones. Dr. Margot Ables & Philipp Deputy CNM called to room for assessment. Ultrasound called for bedside to locate heart tones. Heart rate in 60s by ultrasound. Oxygen applies, IV started. Dr. Jolayne Panther at bedside to discuss c/section. Consent form signed & patient transported to OR.   Per patient, no prenatal care for this pregnancy because didn't have insurance & didn't think anyone would see her. LMP was some time in September, but unsure of when. Last pregnancy was IUFD at 7 months, vaginal delivery, preeclampsia. Denies medical problems or surgical history.

## 2012-07-31 ENCOUNTER — Encounter (HOSPITAL_COMMUNITY): Payer: Self-pay | Admitting: Obstetrics and Gynecology

## 2012-07-31 LAB — RUBELLA SCREEN: Rubella: 1.35 Index — ABNORMAL HIGH (ref <0.90)

## 2012-07-31 NOTE — Progress Notes (Signed)
Subjective: Postpartum Day 2: Cesarean Delivery Patient reports tolerating PO, + flatus and no problems voiding.    Objective: Vital signs in last 24 hours: Temp:  [97.4 F (36.3 C)-99.4 F (37.4 C)] 97.7 F (36.5 C) (04/10 0517) Pulse Rate:  [75-107] 94 (04/10 0517) Resp:  [18-20] 18 (04/10 0517) BP: (117-139)/(73-85) 117/82 mmHg (04/10 0517) SpO2:  [98 %-100 %] 98 % (04/10 0517)  Physical Exam:  General: alert, no distress and mildly obese Lochia: appropriate Uterine Fundus: firm Incision: healing well, no significant drainage, no dehiscence DVT Evaluation: No evidence of DVT seen on physical exam.   Recent Labs  07/29/12 2337 07/30/12 0545  HGB 10.3* 8.6*  HCT 29.9* 25.2*    Assessment/Plan: Status post Cesarean section. Doing well postoperatively.  Continue current care.  Tawnya Crook 07/31/2012, 7:34 AM

## 2012-07-31 NOTE — Progress Notes (Signed)
07/31/12 1400  Clinical Encounter Type  Visited With Patient and family together (sisters, niece)  Visit Type Initial;Spiritual support;Social support  Referral From Nurse  Spiritual Encounters  Spiritual Needs Emotional   Made initial visit with [name] to introduce spiritual care and chaplain services.  She is grateful for support from family and reports being in good spirits, with no pastoral needs at this time.  She is aware of ongoing chaplain availability.  [address] [place], [place] [phone]

## 2012-08-01 DIAGNOSIS — O4590 Premature separation of placenta, unspecified, unspecified trimester: Secondary | ICD-10-CM

## 2012-08-01 DIAGNOSIS — Z98891 History of uterine scar from previous surgery: Secondary | ICD-10-CM

## 2012-08-01 MED ORDER — OXYCODONE-ACETAMINOPHEN 5-325 MG PO TABS: 1.0000 | ORAL_TABLET | ORAL | Status: DC | PRN

## 2012-08-01 MED ORDER — FERROUS SULFATE 325 (65 FE) MG PO TABS: 325.0000 mg | ORAL_TABLET | Freq: Two times a day (BID) | ORAL | Status: DC

## 2012-08-01 MED ORDER — SENNOSIDES-DOCUSATE SODIUM 8.6-50 MG PO TABS: 2.0000 | ORAL_TABLET | Freq: Every day | ORAL | Status: DC

## 2012-08-01 MED ORDER — IBUPROFEN 600 MG PO TABS: 600.0000 mg | ORAL_TABLET | Freq: Four times a day (QID) | ORAL | Status: DC | PRN

## 2012-08-01 NOTE — Discharge Summary (Signed)
Obstetric Discharge Summary Reason for Admission: abdominal pain, placental abruption, nonreassuring fetal heart tones at [redacted]w[redacted]d gestation Prenatal Procedures: ultrasound Intrapartum Procedures: low transverse cesarean section Postpartum Procedures: none Complications-Operative and Postpartum: none Hemoglobin  Date Value Range Status  07/30/2012 8.6* 12.0 - 15.0 g/dL Final     HCT  Date Value Range Status  07/30/2012 25.2* 36.0 - 46.0 % Final    Physical Exam:  General: alert, cooperative and no distress Lochia: appropriate Uterine Fundus: difficult to feel secondary to morbid obesity Incision: healing well, no significant drainage, no dehiscence, no significant erythema DVT Evaluation: No evidence of DVT seen on physical exam. Negative Homan's sign. No cords or calf tenderness.  Discharge Diagnoses: placental abruption, cesarean delivery at [redacted]w[redacted]d  Discharge Information: Date: 08/01/2012 Activity: pelvic rest Diet: routine Medications: PNV, Ibuprofen, Iron, Percocet and Senokot Condition: stable Instructions: refer to practice specific booklet Discharge to: home   Newborn Data: Live born female  Birth Weight: 3 lb 6 oz (1531 g) APGAR: 0, 2  Baby remains in NICU at time of mother's discharge.  Levert Feinstein 08/01/2012, 10:21 AM  I saw and examined patient and agree with above resident note. I reviewed history, delivery summary, labs and vitals. Napoleon Form, MD

## 2012-08-01 NOTE — Progress Notes (Signed)
Clinical Social Work Department PSYCHOSOCIAL ASSESSMENT - MATERNAL/CHILD 08/01/2012  Patient:  [name],[name]  Account Number:  [id number]  Admit Date:  07/29/2012  Childs Name:   Lisa Burns    Clinical Social Worker:  Xylah Early, LCSW   Date/Time:  07/31/2012 03:30 PM  Date Referred:  07/31/2012   Referral source  NICU     Referred reason  NICU  LPNC   Other referral source:    I:  FAMILY / HOME ENVIRONMENT Child's legal guardian:  PARENT  Guardian - Name Guardian - Age Guardian - Address  Tima Jepsen [address] [place], [place] [address]  Antonio Burns  [place]   Other household support members/support persons Name Relationship DOB  Saffie Machuca MOTHER   Tinisha Lubke SISTER    Other support:   MOB states she has a good support system.    II  PSYCHOSOCIAL DATA Information Source:  Patient Interview  Financial and Community Resources Employment:   Financial resources:  Medicaid If Medicaid - County:  [place]  School / Grade:   Maternity Care Coordinator / Child Services Coordination / Early Interventions:  Cultural issues impacting care:   None indicated    III  STRENGTHS Strengths  Adequate Resources  Compliance with medical plan  Other - See comment  Supportive family/friends   Strength comment:  CSW gave pediatrician list   IV  RISK FACTORS AND CURRENT PROBLEMS Current Problem:       V  SOCIAL WORK ASSESSMENT  CSW met with MOB and her sister in MOB's third floor room/304 to introduce myself, complete assessment and evaluate how family is coping with baby's premature birth and admission to NICU.  MOB was pleasant and states she is doing well.  She states she is working on getting baby supplies and has a good support system.  She states she had limited PNC because she found out she was pregnant at approximately 4-5 months and then it took a long time to get her Medicaid approved.  She thinks it has been at this point.  CSW asked MOB to  give an update on baby's condition and she said she is doing well.  She doesn't seem to have a very good understanding of the situation, but then informed CSW that she is just happy the baby is alive, since she lost a baby at 7 months in November of 2010.  CSW discussed signs and symptoms of PPD, especially given the situation and her hx of loss.  She states she will watch for symptoms and let CSW know if she has emotional concerns while baby is in the NICU.  CSW explained support services offered by NICU CSW and gave contact information.  CSW also gave a pediatrician list and a 31 day unlimited bus pass so MOB can visit any time.  CSW informed MOB of hospital drug screen policy due to limited PNC and MOB was not concerned.    VI SOCIAL WORK PLAN Social Work Plan  Psychosocial Support/Ongoing Assessment of Needs   Type of pt/family education:   PPD signs and symptoms  What to expect from a NICU admission  Hospital drug screen policy   If child protective services report - county:   If child protective services report - date:   Information/referral to community resources comment:   No referrals made at this time.   Other social work plan:    

## 2012-08-01 NOTE — Progress Notes (Signed)
Patient discharged in the care of her mother. Patient denies any pain or discomfort. No heavy vaginal bleeding. Abdominal incision is clean and dry. Patient understands incisional care. Infant to remain in NICU. Patient allowed to verbalize fears about leaving baby. Patient comprehended all discharge instructions well. Questions were asked and answered. Discharges were ambulatory.

## 2012-08-04 NOTE — Discharge Summary (Signed)
Attestation of Attending Supervision of Advanced Practitioner (CNM/NP): Evaluation and management procedures were performed by the Advanced Practitioner under my supervision and collaboration.  I have reviewed the Advanced Practitioner's note and chart, and I agree with the management and plan.  HARRAWAY-SMITH, Alvetta Hidrogo 5:46 PM

## 2012-08-21 ENCOUNTER — Encounter (HOSPITAL_COMMUNITY): Payer: Self-pay | Admitting: *Deleted

## 2014-02-22 ENCOUNTER — Encounter (HOSPITAL_COMMUNITY): Payer: Self-pay | Admitting: *Deleted
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
## 2014-06-09 ENCOUNTER — Encounter (HOSPITAL_COMMUNITY): Payer: Self-pay | Admitting: *Deleted

## 2014-06-09 ENCOUNTER — Inpatient Hospital Stay (HOSPITAL_COMMUNITY): Payer: Medicaid Other | Admitting: Anesthesiology

## 2014-06-09 ENCOUNTER — Encounter (HOSPITAL_COMMUNITY)
Admission: AD | Disposition: A | Discharge status: Home / Self Care | Payer: Self-pay | Source: Ambulatory Visit | Attending: Obstetrics and Gynecology

## 2014-06-09 ENCOUNTER — Inpatient Hospital Stay (HOSPITAL_COMMUNITY)
Admission: AD | Admit: 2014-06-09 | Discharge: 2014-06-12 | DRG: 765 | Disposition: A | Discharge status: Home / Self Care | Payer: Medicaid Other | Source: Ambulatory Visit | Attending: Obstetrics and Gynecology | Admitting: Obstetrics and Gynecology

## 2014-06-09 ENCOUNTER — Inpatient Hospital Stay (HOSPITAL_COMMUNITY): Payer: Medicaid Other

## 2014-06-09 DIAGNOSIS — Z3A38 38 weeks gestation of pregnancy: Secondary | ICD-10-CM | POA: Diagnosis present

## 2014-06-09 DIAGNOSIS — O4103X0 Oligohydramnios, third trimester, not applicable or unspecified: Secondary | ICD-10-CM | POA: Diagnosis present

## 2014-06-09 DIAGNOSIS — O1410 Severe pre-eclampsia, unspecified trimester: Secondary | ICD-10-CM | POA: Diagnosis present

## 2014-06-09 DIAGNOSIS — O4693 Antepartum hemorrhage, unspecified, third trimester: Secondary | ICD-10-CM

## 2014-06-09 DIAGNOSIS — O1413 Severe pre-eclampsia, third trimester: Secondary | ICD-10-CM | POA: Diagnosis present

## 2014-06-09 DIAGNOSIS — O9989 Other specified diseases and conditions complicating pregnancy, childbirth and the puerperium: Secondary | ICD-10-CM | POA: Diagnosis present

## 2014-06-09 DIAGNOSIS — O0933 Supervision of pregnancy with insufficient antenatal care, third trimester: Secondary | ICD-10-CM | POA: Diagnosis not present

## 2014-06-09 DIAGNOSIS — Z87891 Personal history of nicotine dependence: Secondary | ICD-10-CM | POA: Diagnosis not present

## 2014-06-09 DIAGNOSIS — O4690 Antepartum hemorrhage, unspecified, unspecified trimester: Secondary | ICD-10-CM | POA: Insufficient documentation

## 2014-06-09 DIAGNOSIS — O3421 Maternal care for scar from previous cesarean delivery: Secondary | ICD-10-CM | POA: Diagnosis present

## 2014-06-09 LAB — COMPREHENSIVE METABOLIC PANEL
ALBUMIN: 2.2 g/dL — AB (ref 3.5–5.2)
ALT: 13 U/L (ref 0–35)
AST: 24 U/L (ref 0–37)
Alkaline Phosphatase: 191 U/L — ABNORMAL HIGH (ref 39–117)
Anion gap: 5 (ref 5–15)
BILIRUBIN TOTAL: 0.3 mg/dL (ref 0.3–1.2)
BUN: 9 mg/dL (ref 6–23)
CHLORIDE: 109 mmol/L (ref 96–112)
CO2: 24 mmol/L (ref 19–32)
Calcium: 8.8 mg/dL (ref 8.4–10.5)
Creatinine, Ser: 0.9 mg/dL (ref 0.50–1.10)
GFR calc Af Amer: >90 mL/min (ref >90)
GFR calc non Af Amer: >90 mL/min (ref >90)
Glucose, Bld: 91 mg/dL (ref 70–99)
Potassium: 4 mmol/L (ref 3.5–5.1)
Sodium: 138 mmol/L (ref 135–145)
Total Protein: 5.4 g/dL — ABNORMAL LOW (ref 6.0–8.3)

## 2014-06-09 LAB — CBC WITH DIFFERENTIAL/PLATELET
BASOS ABS: 0.1 10*3/uL (ref 0.0–0.1)
BASOS PCT: 0 % (ref 0–1)
Eosinophils Absolute: 0.2 10*3/uL (ref 0.0–0.7)
Eosinophils Relative: 1 % (ref 0–5)
HEMATOCRIT: 34.3 % — AB (ref 36.0–46.0)
HEMOGLOBIN: 11.8 g/dL — AB (ref 12.0–15.0)
LYMPHS ABS: 2.6 10*3/uL (ref 0.7–4.0)
Lymphocytes Relative: 18 % (ref 12–46)
MCH: 28 pg (ref 26.0–34.0)
MCHC: 34.4 g/dL (ref 30.0–36.0)
MCV: 81.3 fL (ref 78.0–100.0)
MONO ABS: 1.4 10*3/uL — AB (ref 0.1–1.0)
MONOS PCT: 10 % (ref 3–12)
Neutro Abs: 9.8 10*3/uL — ABNORMAL HIGH (ref 1.7–7.7)
Neutrophils Relative %: 71 % (ref 43–77)
Platelets: 268 10*3/uL (ref 150–400)
RBC: 4.22 MIL/uL (ref 3.87–5.11)
RDW: 13.9 % (ref 11.5–15.5)
WBC: 13.9 10*3/uL — AB (ref 4.0–10.5)

## 2014-06-09 LAB — FIBRINOGEN: Fibrinogen: 483 mg/dL — ABNORMAL HIGH (ref 204–475)

## 2014-06-09 LAB — RAPID HIV SCREEN (WH-MAU): SUDS RAPID HIV SCREEN: NONREACTIVE

## 2014-06-09 LAB — LACTATE DEHYDROGENASE: LDH: 285 U/L — AB (ref 94–250)

## 2014-06-09 LAB — URIC ACID: Uric Acid, Serum: 6.3 mg/dL (ref 2.4–7.0)

## 2014-06-09 LAB — APTT: APTT: 31 s (ref 24–37)

## 2014-06-09 LAB — PREPARE RBC (CROSSMATCH)

## 2014-06-09 SURGERY — Surgical Case
Anesthesia: Spinal

## 2014-06-09 MED ORDER — CITRIC ACID-SODIUM CITRATE 334-500 MG/5ML PO SOLN
30.0000 mL | Freq: Once | ORAL | Status: AC
  Administered 2014-06-09: 30 mL via ORAL

## 2014-06-09 MED ORDER — SODIUM CHLORIDE 0.9 % IV SOLN: Freq: Once | INTRAVENOUS | Status: DC

## 2014-06-09 MED ORDER — MORPHINE SULFATE 0.5 MG/ML IJ SOLN
INTRAMUSCULAR | Status: AC
  Filled 2014-06-09: qty 10

## 2014-06-09 MED ORDER — HYDRALAZINE HCL 20 MG/ML IJ SOLN
20.0000 mg | Freq: Once | INTRAMUSCULAR | Status: AC
  Administered 2014-06-09: 10 mg via INTRAVENOUS
  Filled 2014-06-09: qty 1

## 2014-06-09 MED ORDER — CEFAZOLIN SODIUM-DEXTROSE 2-3 GM-% IV SOLR
INTRAVENOUS | Status: AC
Start: 2014-06-09 — End: 2014-06-09
  Filled 2014-06-09: qty 50

## 2014-06-09 MED ORDER — OXYTOCIN 10 UNIT/ML IJ SOLN
INTRAMUSCULAR | Status: AC
  Filled 2014-06-09: qty 4

## 2014-06-09 MED ORDER — MAGNESIUM SULFATE 40 G IN LACTATED RINGERS - SIMPLE
2.0000 g/h | INTRAVENOUS | Status: DC
  Administered 2014-06-09 – 2014-06-10 (×2): 2 g/h via INTRAVENOUS
  Filled 2014-06-09 (×2): qty 500

## 2014-06-09 MED ORDER — FENTANYL CITRATE 0.05 MG/ML IJ SOLN
INTRAMUSCULAR | Status: AC
  Filled 2014-06-09: qty 2

## 2014-06-09 MED ORDER — ONDANSETRON HCL 4 MG/2ML IJ SOLN
INTRAMUSCULAR | Status: AC
  Filled 2014-06-09: qty 2

## 2014-06-09 MED ORDER — MAGNESIUM SULFATE BOLUS VIA INFUSION
4.0000 g | Freq: Once | INTRAVENOUS | Status: AC
  Administered 2014-06-09: 4 g via INTRAVENOUS
  Filled 2014-06-09: qty 500

## 2014-06-09 MED ORDER — LABETALOL HCL 5 MG/ML IV SOLN
20.0000 mg | Freq: Once | INTRAVENOUS | Status: DC
  Filled 2014-06-09: qty 4

## 2014-06-09 SURGICAL SUPPLY — 33 items
BENZOIN TINCTURE PRP APPL 2/3 (GAUZE/BANDAGES/DRESSINGS) ×3 IMPLANT
CLAMP CORD UMBIL (MISCELLANEOUS) IMPLANT
CLOSURE STERI STRIP 1/2 X4 (GAUZE/BANDAGES/DRESSINGS) ×3 IMPLANT
CLOSURE WOUND 1/2 X4 (GAUZE/BANDAGES/DRESSINGS)
CLOTH BEACON ORANGE TIMEOUT ST (SAFETY) ×3 IMPLANT
DRAPE SHEET LG 3/4 BI-LAMINATE (DRAPES) IMPLANT
DRSG OPSITE POSTOP 4X10 (GAUZE/BANDAGES/DRESSINGS) ×3 IMPLANT
DURAPREP 26ML APPLICATOR (WOUND CARE) ×3 IMPLANT
ELECT REM PT RETURN 9FT ADLT (ELECTROSURGICAL) ×3
ELECTRODE REM PT RTRN 9FT ADLT (ELECTROSURGICAL) ×1 IMPLANT
EXTRACTOR VACUUM KIWI (MISCELLANEOUS) IMPLANT
GLOVE BIO SURGEON ST LM GN SZ9 (GLOVE) ×3 IMPLANT
GLOVE BIOGEL PI IND STRL 9 (GLOVE) ×1 IMPLANT
GLOVE BIOGEL PI INDICATOR 9 (GLOVE) ×2
GOWN STRL REUS W/TWL 2XL LVL3 (GOWN DISPOSABLE) ×3 IMPLANT
GOWN STRL REUS W/TWL LRG LVL3 (GOWN DISPOSABLE) ×3 IMPLANT
NEEDLE HYPO 25X5/8 SAFETYGLIDE (NEEDLE) IMPLANT
NS IRRIG 1000ML POUR BTL (IV SOLUTION) ×3 IMPLANT
PACK C SECTION WH (CUSTOM PROCEDURE TRAY) ×3 IMPLANT
PAD OB MATERNITY 4.3X12.25 (PERSONAL CARE ITEMS) ×3 IMPLANT
RTRCTR C-SECT PINK 25CM LRG (MISCELLANEOUS) IMPLANT
RTRCTR C-SECT PINK 34CM XLRG (MISCELLANEOUS) IMPLANT
STRIP CLOSURE SKIN 1/2X4 (GAUZE/BANDAGES/DRESSINGS) IMPLANT
SUT MNCRL 0 VIOLET CTX 36 (SUTURE) ×2 IMPLANT
SUT MONOCRYL 0 CTX 36 (SUTURE) ×4
SUT VIC AB 0 CT1 27 (SUTURE) ×2
SUT VIC AB 0 CT1 27XBRD ANBCTR (SUTURE) ×1 IMPLANT
SUT VIC AB 2-0 CT1 27 (SUTURE) ×2
SUT VIC AB 2-0 CT1 TAPERPNT 27 (SUTURE) ×1 IMPLANT
SUT VIC AB 4-0 KS 27 (SUTURE) ×3 IMPLANT
SYR BULB IRRIGATION 50ML (SYRINGE) IMPLANT
TOWEL OR 17X24 6PK STRL BLUE (TOWEL DISPOSABLE) ×3 IMPLANT
TRAY FOLEY CATH 14FR (SET/KITS/TRAYS/PACK) ×3 IMPLANT

## 2014-06-09 NOTE — MAU Note (Signed)
To OR

## 2014-06-09 NOTE — Anesthesia Preprocedure Evaluation (Addendum)
Anesthesia Evaluation  Patient identified by MRN, date of birth, ID band Patient awake    Reviewed: Allergy & Precautions, H&P , Patient's Chart, lab work & pertinent test results  Airway Mallampati: IV  TM Distance: >3 FB Neck ROM: full  Mouth opening: Limited Mouth Opening  Dental no notable dental hx.    Pulmonary former smoker,  breath sounds clear to auscultation  Pulmonary exam normal       Cardiovascular Exercise Tolerance: Good hypertension, Rhythm:regular Rate:Normal     Neuro/Psych    GI/Hepatic   Endo/Other  Morbid obesity  Renal/GU      Musculoskeletal   Abdominal   Peds  Hematology   Anesthesia Other Findings Hypertension (Pregnancy induced hypertension) - STAT GA for last CS.     MO         Reproductive/Obstetrics                           Anesthesia Physical Anesthesia Plan  ASA: III and emergent  Anesthesia Plan: Spinal   Post-op Pain Management:    Induction:   Airway Management Planned:   Additional Equipment:   Intra-op Plan:   Post-operative Plan:   Informed Consent: I have reviewed the patients History and Physical, chart, labs and discussed the procedure including the risks, benefits and alternatives for the proposed anesthesia with the patient or authorized representative who has indicated his/her understanding and acceptance.   Dental Advisory Given  Plan Discussed with: CRNA  Anesthesia Plan Comments: (Lab work confirmed with CRNA in room. Platelets okay. Discussed spinal anesthetic, and patient consents to the procedure:  included risk of possible headache,backache, failed block, allergic reaction, and nerve injury. This patient was asked if she had any questions or concerns before the procedure started. )        Anesthesia Quick Evaluation

## 2014-06-09 NOTE — MAU Note (Signed)
Pt given 10 mg Apresoline IV push for second dose. BP 189 119

## 2014-06-09 NOTE — MAU Provider Note (Signed)
[name] is a 24 y.o. female presenting for lower abdominal pain at site of prior cesarean section, Severe preeclampsia at term.., with BP 190-214/130's, Pt reports she allegedly did not know she was pregnant til pregnancy test today, Bedside u/s shows a singleton vertex presentation fetus, BPD unobtainable, oligohydramnios, and Gest age by Femur length x3 at 38wks,. No hx of ROM. Pt denies headache or scotoma, and reports that she has been bleeding off and on throughout the pregnancy , so she did not know of pregnancy.  History OB History    Gravida Para Term Preterm AB TAB SAB Ectopic Multiple Living   2 2  2      1      Past Medical History  Diagnosis Date  . Hypertension     pre eclampsia with G1  . Pregnancy induced hypertension   . Preterm labor    Past Surgical History  Procedure Laterality Date  . Miscar    . No past surgeries    . Cesarean section N/A 07/29/2012    Procedure: CESAREAN SECTION;  Surgeon: Catalina AntiguaPeggy Constant, MD;  Location: [facility];  Service: Obstetrics;  Laterality: N/A;  Emergency prior cesarean under general anesthesial for abruption at 30+5wk diagnosed in MAU , no PNC that pregnancy also,  Family History: family history is not on file. Social History:  reports that she has quit smoking. She does not have any smokeless tobacco history on file. She reports that she drinks about 2.4 oz of alcohol per week. Her drug history is not on file.   Prenatal Transfer Tool  Maternal Diabetes: unknown Genetic Screening: none Maternal Ultrasounds/Referrals: none til tonight, singleton vertex with oligohydramnios, posterior placenta,  Fetal Ultrasounds or other Referrals:  None Maternal Substance Abuse:  unknown Significant Maternal Medications:  None Significant Maternal Lab Results:  pending Other Comments:  no pnc  ROS  Dilation: Closed Effacement (%): Thick Exam by:: Dr. Caswell CorwinFeruson Blood pressure 208/130, pulse 122, unknown if currently breastfeeding. Exam  Pulse  Rate:  [109-134] 121 (02/17 2250) BP: (189-214)/(119-151) 189/119 mmHg (02/17 2250)  Physical Exam  Constitutional: She is oriented to person, place, and time. She appears well-developed and well-nourished.  Obese ht 5'2" llast known wt 270  Eyes: Pupils are equal, round, and reactive to light.  Neck: Normal range of motion.  Cardiovascular: Normal rate.   Respiratory: Effort normal.  GI: Soft.  Thick habitus.  Genitourinary: Vagina normal.  Term sized gravid uterus, lower abd transverse incision Cervix long posterior, presenting part out of pelvis, exam tolerated poorly by patient.  Musculoskeletal: Normal range of motion.  Neurological: She is alert and oriented to person, place, and time.  DTR's 3-4+ with one beat clonus.  Psychiatric: She has a normal mood and affect. Her behavior is normal. Thought content normal.     Assessment/Plan: Pregnancy term, Severe preeclampsia , no prenatal care, Prior cesarean for preterm abruption, unfavorable cervix,  Plan: Magnesium sulfate 4g /2g Apresoline 10 mg iv x 1 without significant success , now Labetalol 10 iv Cesarean delivery recommended once stabilized and labs back.  Danise Dehne V 06/09/2014, 10:42 PM

## 2014-06-10 ENCOUNTER — Encounter (HOSPITAL_COMMUNITY): Payer: Self-pay | Admitting: *Deleted

## 2014-06-10 DIAGNOSIS — O1410 Severe pre-eclampsia, unspecified trimester: Secondary | ICD-10-CM | POA: Diagnosis present

## 2014-06-10 DIAGNOSIS — O4103X0 Oligohydramnios, third trimester, not applicable or unspecified: Secondary | ICD-10-CM

## 2014-06-10 DIAGNOSIS — O3421 Maternal care for scar from previous cesarean delivery: Secondary | ICD-10-CM

## 2014-06-10 DIAGNOSIS — Z3A38 38 weeks gestation of pregnancy: Secondary | ICD-10-CM | POA: Insufficient documentation

## 2014-06-10 DIAGNOSIS — O1413 Severe pre-eclampsia, third trimester: Secondary | ICD-10-CM

## 2014-06-10 DIAGNOSIS — O4690 Antepartum hemorrhage, unspecified, unspecified trimester: Secondary | ICD-10-CM | POA: Insufficient documentation

## 2014-06-10 DIAGNOSIS — O0933 Supervision of pregnancy with insufficient antenatal care, third trimester: Secondary | ICD-10-CM

## 2014-06-10 LAB — CBC
HEMATOCRIT: 30.8 % — AB (ref 36.0–46.0)
Hemoglobin: 10.5 g/dL — ABNORMAL LOW (ref 12.0–15.0)
MCH: 27.7 pg (ref 26.0–34.0)
MCHC: 34.1 g/dL (ref 30.0–36.0)
MCV: 81.3 fL (ref 78.0–100.0)
Platelets: 250 10*3/uL (ref 150–400)
RBC: 3.79 MIL/uL — AB (ref 3.87–5.11)
RDW: 14.2 % (ref 11.5–15.5)
WBC: 20.6 10*3/uL — AB (ref 4.0–10.5)

## 2014-06-10 LAB — PROTEIN / CREATININE RATIO, URINE: Creatinine, Urine: 181 mg/dL

## 2014-06-10 LAB — HEPATITIS B SURFACE ANTIGEN: Hepatitis B Surface Ag: NEGATIVE

## 2014-06-10 MED ORDER — SCOPOLAMINE 1 MG/3DAYS TD PT72
1.0000 | MEDICATED_PATCH | Freq: Once | TRANSDERMAL | Status: DC
Start: 2014-06-10 — End: 2014-06-11

## 2014-06-10 MED ORDER — NALBUPHINE HCL 10 MG/ML IJ SOLN: 5.0000 mg | Freq: Once | INTRAMUSCULAR | Status: DC | PRN

## 2014-06-10 MED ORDER — OXYCODONE-ACETAMINOPHEN 5-325 MG PO TABS
1.0000 | ORAL_TABLET | ORAL | Status: DC | PRN
  Administered 2014-06-10 – 2014-06-12 (×3): 1 via ORAL
  Filled 2014-06-10 (×3): qty 1

## 2014-06-10 MED ORDER — NALBUPHINE HCL 10 MG/ML IJ SOLN: 5.0000 mg | INTRAMUSCULAR | Status: DC | PRN

## 2014-06-10 MED ORDER — LACTATED RINGERS IV SOLN
INTRAVENOUS | Status: DC | PRN
  Administered 2014-06-09: via INTRAVENOUS

## 2014-06-10 MED ORDER — DIPHENHYDRAMINE HCL 25 MG PO CAPS: 25.0000 mg | ORAL_CAPSULE | ORAL | Status: DC | PRN

## 2014-06-10 MED ORDER — NALBUPHINE HCL 10 MG/ML IJ SOLN: 5.0000 mg | Freq: Once | INTRAMUSCULAR | Status: AC | PRN

## 2014-06-10 MED ORDER — SODIUM BICARBONATE 8.4 % IV SOLN
INTRAVENOUS | Status: DC | PRN
  Administered 2014-06-10: 3 mL via EPIDURAL
  Administered 2014-06-10: 2 mL via EPIDURAL
  Administered 2014-06-10: 5 mL via EPIDURAL
  Administered 2014-06-10: 3 mL via EPIDURAL
  Administered 2014-06-10: 2 mL via EPIDURAL
  Administered 2014-06-10: 7 mL via EPIDURAL

## 2014-06-10 MED ORDER — FENTANYL CITRATE 0.05 MG/ML IJ SOLN
INTRAMUSCULAR | Status: AC
  Filled 2014-06-10: qty 2

## 2014-06-10 MED ORDER — ONDANSETRON HCL 4 MG/2ML IJ SOLN
INTRAMUSCULAR | Status: DC | PRN
  Administered 2014-06-10: 4 mg via INTRAVENOUS

## 2014-06-10 MED ORDER — ONDANSETRON HCL 4 MG PO TABS: 4.0000 mg | ORAL_TABLET | ORAL | Status: DC | PRN

## 2014-06-10 MED ORDER — MENTHOL 3 MG MT LOZG: 1.0000 | LOZENGE | OROMUCOSAL | Status: DC | PRN

## 2014-06-10 MED ORDER — LIDOCAINE-EPINEPHRINE (PF) 2 %-1:200000 IJ SOLN
INTRAMUSCULAR | Status: AC
  Filled 2014-06-10: qty 20

## 2014-06-10 MED ORDER — FENTANYL CITRATE 0.05 MG/ML IJ SOLN
INTRAMUSCULAR | Status: DC | PRN
  Administered 2014-06-10: 50 ug via INTRAVENOUS

## 2014-06-10 MED ORDER — SENNOSIDES-DOCUSATE SODIUM 8.6-50 MG PO TABS
2.0000 | ORAL_TABLET | ORAL | Status: DC
  Administered 2014-06-10 – 2014-06-12 (×2): 2 via ORAL
  Filled 2014-06-10 (×3): qty 2

## 2014-06-10 MED ORDER — ZOLPIDEM TARTRATE 5 MG PO TABS: 5.0000 mg | ORAL_TABLET | Freq: Every evening | ORAL | Status: DC | PRN

## 2014-06-10 MED ORDER — OXYTOCIN 40 UNITS IN LACTATED RINGERS INFUSION - SIMPLE MED
INTRAVENOUS | Status: AC
  Filled 2014-06-10: qty 1000

## 2014-06-10 MED ORDER — NALBUPHINE HCL 10 MG/ML IJ SOLN
INTRAMUSCULAR | Status: AC
  Filled 2014-06-10: qty 1

## 2014-06-10 MED ORDER — LANOLIN HYDROUS EX OINT: 1.0000 "application " | TOPICAL_OINTMENT | CUTANEOUS | Status: DC | PRN

## 2014-06-10 MED ORDER — HYDROMORPHONE HCL 1 MG/ML IJ SOLN: 0.2500 mg | INTRAMUSCULAR | Status: DC | PRN

## 2014-06-10 MED ORDER — WITCH HAZEL-GLYCERIN EX PADS: 1.0000 "application " | MEDICATED_PAD | CUTANEOUS | Status: DC | PRN

## 2014-06-10 MED ORDER — FENTANYL CITRATE 0.05 MG/ML IJ SOLN
25.0000 ug | INTRAMUSCULAR | Status: DC | PRN
  Administered 2014-06-10 (×2): 50 ug via INTRAVENOUS

## 2014-06-10 MED ORDER — OXYCODONE-ACETAMINOPHEN 5-325 MG PO TABS: 2.0000 | ORAL_TABLET | ORAL | Status: DC | PRN

## 2014-06-10 MED ORDER — MEPERIDINE HCL 25 MG/ML IJ SOLN: 6.2500 mg | INTRAMUSCULAR | Status: DC | PRN

## 2014-06-10 MED ORDER — MIDAZOLAM HCL 5 MG/5ML IJ SOLN
INTRAMUSCULAR | Status: DC | PRN
  Administered 2014-06-10 (×3): 1 mg via INTRAVENOUS

## 2014-06-10 MED ORDER — DEXTROSE 5 % IV SOLN
1.0000 ug/kg/h | INTRAVENOUS | Status: DC | PRN
  Filled 2014-06-10: qty 2

## 2014-06-10 MED ORDER — TETANUS-DIPHTH-ACELL PERTUSSIS 5-2.5-18.5 LF-MCG/0.5 IM SUSP
0.5000 mL | Freq: Once | INTRAMUSCULAR | Status: AC
Start: 2014-06-11 — End: 2014-06-11
  Administered 2014-06-11: 0.5 mL via INTRAMUSCULAR
  Filled 2014-06-10: qty 0.5

## 2014-06-10 MED ORDER — DIPHENHYDRAMINE HCL 50 MG/ML IJ SOLN
12.5000 mg | INTRAMUSCULAR | Status: DC | PRN
Start: 2014-06-10 — End: 2014-06-11
  Administered 2014-06-10: 12.5 mg via INTRAVENOUS
  Filled 2014-06-10: qty 1

## 2014-06-10 MED ORDER — ONDANSETRON HCL 4 MG/2ML IJ SOLN
INTRAMUSCULAR | Status: AC
  Filled 2014-06-10: qty 2

## 2014-06-10 MED ORDER — PRENATAL MULTIVITAMIN CH
1.0000 | ORAL_TABLET | Freq: Every day | ORAL | Status: DC
  Administered 2014-06-10 – 2014-06-12 (×3): 1 via ORAL
  Filled 2014-06-10 (×3): qty 1

## 2014-06-10 MED ORDER — SCOPOLAMINE 1 MG/3DAYS TD PT72: 1.0000 | MEDICATED_PATCH | Freq: Once | TRANSDERMAL | Status: DC

## 2014-06-10 MED ORDER — ONDANSETRON HCL 4 MG/2ML IJ SOLN: 4.0000 mg | INTRAMUSCULAR | Status: DC | PRN

## 2014-06-10 MED ORDER — DIPHENHYDRAMINE HCL 25 MG PO CAPS
25.0000 mg | ORAL_CAPSULE | Freq: Four times a day (QID) | ORAL | Status: DC | PRN
  Administered 2014-06-10: 25 mg via ORAL
  Filled 2014-06-10: qty 1

## 2014-06-10 MED ORDER — LABETALOL HCL 5 MG/ML IV SOLN
INTRAVENOUS | Status: AC
  Filled 2014-06-10: qty 4

## 2014-06-10 MED ORDER — SIMETHICONE 80 MG PO CHEW
80.0000 mg | CHEWABLE_TABLET | ORAL | Status: DC
  Administered 2014-06-10 – 2014-06-12 (×2): 80 mg via ORAL
  Filled 2014-06-10 (×2): qty 1

## 2014-06-10 MED ORDER — SODIUM CHLORIDE 0.9 % IJ SOLN: 3.0000 mL | INTRAMUSCULAR | Status: DC | PRN

## 2014-06-10 MED ORDER — PHENYLEPHRINE HCL 10 MG/ML IJ SOLN
INTRAMUSCULAR | Status: DC | PRN
  Administered 2014-06-10: 50 ug via INTRAVENOUS
  Administered 2014-06-10: 40 ug via INTRAVENOUS

## 2014-06-10 MED ORDER — OXYTOCIN 40 UNITS IN LACTATED RINGERS INFUSION - SIMPLE MED
62.5000 mL/h | INTRAVENOUS | Status: AC
  Administered 2014-06-10: 62.5 mL/h via INTRAVENOUS

## 2014-06-10 MED ORDER — IBUPROFEN 600 MG PO TABS
600.0000 mg | ORAL_TABLET | Freq: Four times a day (QID) | ORAL | Status: DC
  Administered 2014-06-10 – 2014-06-12 (×10): 600 mg via ORAL
  Filled 2014-06-10 (×10): qty 1

## 2014-06-10 MED ORDER — MEPERIDINE HCL 25 MG/ML IJ SOLN
INTRAMUSCULAR | Status: DC | PRN
  Administered 2014-06-10 (×2): 12.5 mg via INTRAVENOUS

## 2014-06-10 MED ORDER — DIPHENHYDRAMINE HCL 25 MG PO CAPS
25.0000 mg | ORAL_CAPSULE | ORAL | Status: DC | PRN
  Filled 2014-06-10: qty 1

## 2014-06-10 MED ORDER — DIBUCAINE 1 % RE OINT: 1.0000 "application " | TOPICAL_OINTMENT | RECTAL | Status: DC | PRN

## 2014-06-10 MED ORDER — ONDANSETRON HCL 4 MG/2ML IJ SOLN: 4.0000 mg | Freq: Three times a day (TID) | INTRAMUSCULAR | Status: DC | PRN

## 2014-06-10 MED ORDER — DEXTROSE 5 % IV SOLN
INTRAVENOUS | Status: AC
  Filled 2014-06-10: qty 3000

## 2014-06-10 MED ORDER — NALOXONE HCL 0.4 MG/ML IJ SOLN: 0.4000 mg | INTRAMUSCULAR | Status: DC | PRN

## 2014-06-10 MED ORDER — NALBUPHINE HCL 10 MG/ML IJ SOLN
5.0000 mg | INTRAMUSCULAR | Status: DC | PRN
  Administered 2014-06-10: 5 mg via SUBCUTANEOUS

## 2014-06-10 MED ORDER — PHENYLEPHRINE 8 MG IN D5W 100 ML (0.08MG/ML) PREMIX OPTIME
INJECTION | INTRAVENOUS | Status: AC
  Filled 2014-06-10: qty 100

## 2014-06-10 MED ORDER — DIPHENHYDRAMINE HCL 50 MG/ML IJ SOLN: 12.5000 mg | INTRAMUSCULAR | Status: DC | PRN

## 2014-06-10 MED ORDER — CHLOROPROCAINE HCL 3 % IJ SOLN
INTRAMUSCULAR | Status: AC
  Filled 2014-06-10: qty 20

## 2014-06-10 MED ORDER — LABETALOL HCL 5 MG/ML IV SOLN
20.0000 mg | INTRAVENOUS | Status: DC | PRN
  Administered 2014-06-10 – 2014-06-11 (×4): 20 mg via INTRAVENOUS
  Filled 2014-06-10 (×4): qty 4

## 2014-06-10 MED ORDER — MIDAZOLAM HCL 2 MG/2ML IJ SOLN
INTRAMUSCULAR | Status: AC
  Filled 2014-06-10: qty 2

## 2014-06-10 MED ORDER — SIMETHICONE 80 MG PO CHEW: 80.0000 mg | CHEWABLE_TABLET | ORAL | Status: DC | PRN

## 2014-06-10 MED ORDER — NALOXONE HCL 1 MG/ML IJ SOLN
1.0000 ug/kg/h | INTRAVENOUS | Status: DC | PRN
  Filled 2014-06-10: qty 2

## 2014-06-10 MED ORDER — SIMETHICONE 80 MG PO CHEW
80.0000 mg | CHEWABLE_TABLET | Freq: Three times a day (TID) | ORAL | Status: DC
  Administered 2014-06-10 – 2014-06-12 (×8): 80 mg via ORAL
  Filled 2014-06-10 (×8): qty 1

## 2014-06-10 MED ORDER — LACTATED RINGERS IV SOLN
INTRAVENOUS | Status: DC
  Administered 2014-06-10 – 2014-06-11 (×3): via INTRAVENOUS

## 2014-06-10 MED ORDER — MEPERIDINE HCL 25 MG/ML IJ SOLN
INTRAMUSCULAR | Status: AC
  Filled 2014-06-10: qty 1

## 2014-06-10 MED ORDER — DEXTROSE 5 % IV SOLN
3.0000 g | INTRAVENOUS | Status: DC | PRN
  Administered 2014-06-10: 3 g via INTRAVENOUS

## 2014-06-10 MED ORDER — OXYTOCIN 10 UNIT/ML IJ SOLN
40.0000 [IU] | INTRAVENOUS | Status: DC | PRN
  Administered 2014-06-10: 40 [IU] via INTRAVENOUS

## 2014-06-10 NOTE — Op Note (Signed)
See operative note completed in the brief of note

## 2014-06-10 NOTE — Transfer of Care (Signed)
Immediate Anesthesia Transfer of Care Note  Patient: [name]  Procedure(s) Performed: Procedure(s): CESAREAN SECTION (N/A)  Patient Location: PACU  Anesthesia Type:Epidural  Level of Consciousness: awake, alert  and oriented  Airway & Oxygen Therapy: Patient Spontanous Breathing  Post-op Assessment: Report given to RN and Post -op Vital signs reviewed and stable  Post vital signs: Reviewed and stable  Last Vitals:  Filed Vitals:   06/09/14 2330  BP: 175/95  Pulse: 107    Complications: No apparent anesthesia complications

## 2014-06-10 NOTE — Anesthesia Procedure Notes (Addendum)
Epidural Patient location during procedure: OR  Preanesthetic Checklist Completed: patient identified, site marked, surgical consent, pre-op evaluation, timeout performed, IV checked, risks and benefits discussed and monitors and equipment checked  Epidural Patient position: sitting Prep: site prepped and draped and DuraPrep Patient monitoring: continuous pulse ox and blood pressure Approach: midline Location: L4-L5 Injection technique: LOR air  Needle:  Needle type: Tuohy  Needle gauge: 17 G Needle length: 9 cm and 9 Needle insertion depth: 9 cm Catheter type: closed end flexible Catheter size: 19 Gauge Catheter at skin depth: 17 cm Test dose: negative  Assessment Events: blood not aspirated, injection not painful, no injection resistance, negative IV test and no paresthesia  Additional Notes 2cc 2% Xylo SAB test dose thru catheter after (-) asp CSF/Heme

## 2014-06-10 NOTE — Anesthesia Postprocedure Evaluation (Signed)
  Anesthesia Post-op Note  Patient: [name]  Procedure(s) Performed: Procedure(s): CESAREAN SECTION (N/A)  Patient is awake, responsive, moving her legs, and has signs of resolution of her numbness. Pain and nausea are reasonably well controlled. Vital signs are stable and clinically acceptable. Oxygen saturation is clinically acceptable. There are no apparent anesthetic complications at this time. Patient is ready for discharge.

## 2014-06-10 NOTE — Anesthesia Postprocedure Evaluation (Signed)
Anesthesia Post Note  Patient: [name]  Procedure(s) Performed: Procedure(s) (LRB): CESAREAN SECTION (N/A)  Anesthesia type: Epidural  Patient location: Mother/Baby  Post pain: Pain level controlled  Post assessment: Post-op Vital signs reviewed  Last Vitals:  Filed Vitals:   06/10/14 1000  BP: 150/92  Pulse: 100  Temp:   Resp:     Post vital signs: Reviewed  Level of consciousness:alert  Complications: No apparent anesthesia complications

## 2014-06-10 NOTE — Progress Notes (Signed)
UR chart review completed.  

## 2014-06-10 NOTE — Brief Op Note (Signed)
06/09/2014 - 06/10/2014  1:19 AM  PATIENT:  [name]  23 y.o. female  PRE-OPERATIVE DIAGNOSIS:  Repeat cesarean section,, severe Preeclampsia no prenatal care  POST-OPERATIVE DIAGNOSIS:  Repeat, Preeclampsia severe, no prenatal care  PROCEDURE:  Procedure(s): CESAREAN SECTION (N/A) repeat low transverse  SURGEON:  Surgeon(s) and Role:    * Tilda BurrowJohn Darrold Bezek V, MD - Primary  PHYSICIAN ASSISTANT:   ASSISTANTS: none   ANESTHESIA:   epidural  EBL:  Total I/O In: 400 [I.V.:400] Out: -   BLOOD ADMINISTERED:none  DRAINS: Urinary Catheter (Foley)   LOCAL MEDICATIONS USED:  Amount: Bupivacaine 3% 30 cc intra-abdominal ml and OTHER   SPECIMEN:  Source of Specimen:  Placenta delivered delivery, blood gas performed  DISPOSITION OF SPECIMEN:  PATHOLOGY  COUNTS:  YES  TOURNIQUET:  * No tourniquets in log *  DICTATION: .Dragon Dictation  PLAN OF CARE: Admit to inpatient   PATIENT DISPOSITION:  PACU - hemodynamically stable.   Delay start of Pharmacological VTE agent (>24hrs) due to surgical blood loss or risk of bleeding: not applicable Details of procedure: Patient was taken to the operating room, and anesthesia achieved with significant difficulty due to the patient's extreme anxiety. Dr. Jean RosenthalJackson did an excellent Job with calm support plus Versed, and achieved epidural placement. Fetal heart was confirmed as normal, abdomen prepped and draped. Foley catheter was inserted with the patient developing membrane rupture about this time and thick meconium identified. After adequate analgesia was confirmed, a transverse incision was repeated along the old scar,, and omentum identified just behind the fascia. Peritoneal opening was enlarged, Alexis wound retractor placed in position, bladder flap developed and transverse uterine incision performed delivering the fetal vertex. There was thick meconium present, oligohydramnios. Cord blood samples were obtained. Placenta was delivered intact.  Membranes were removed and uterus swabbed out and then closed with running locking 0 Monocryl followed by running into new 0 Monocryl second layer. Abdomen was given Marcaine 30 cc to the patient discomfort, and the patient did much better after that. Her name was closed with 2-0 Vicryl, the fascia closed with 0 Vicryl, subcutaneous fatty tissue closed with 2-0 plain in subcuticular 4-0 Vicryl closed the skin Steri-Strips were applied patient to recovery room in good condition. During the case) 1:30 to 145.

## 2014-06-10 NOTE — MAU Note (Addendum)
IN OR-  FHR- 150

## 2014-06-10 NOTE — MAU Note (Signed)
GETTING   SPINAL

## 2014-06-11 ENCOUNTER — Encounter (HOSPITAL_COMMUNITY): Payer: Self-pay | Admitting: Obstetrics and Gynecology

## 2014-06-11 LAB — DRUGS OF ABUSE SCREEN W/O ALC, ROUTINE URINE
AMPHETAMINE SCRN UR: NEGATIVE
BARBITURATE QUANT UR: NEGATIVE
Benzodiazepines.: NEGATIVE
COCAINE METABOLITES: NEGATIVE
Creatinine,U: 184.5 mg/dL
Marijuana Metabolite: NEGATIVE
Methadone: NEGATIVE
Opiate Screen, Urine: NEGATIVE
PHENCYCLIDINE (PCP): NEGATIVE
Propoxyphene: NEGATIVE

## 2014-06-11 LAB — RPR: RPR Ser Ql: NONREACTIVE

## 2014-06-11 NOTE — Progress Notes (Signed)
Subjective: Postpartum Day 1: Cesarean Delivery Patient reports tolerating PO.  Pt with no HA.  Pain is well controlled.    Objective: Vital signs in last 24 hours: Temp:  [97.9 F (36.6 C)-98.6 F (37 C)] 97.9 F (36.6 C) (02/19 0829) Pulse Rate:  [72-104] 94 (02/19 0900) Resp:  [16-20] 18 (02/19 0900) BP: (112-167)/(65-105) 152/94 mmHg (02/19 0900) SpO2:  [96 %-100 %] 100 % (02/19 0900) Weight:  [255 lb 9.6 oz (115.939 kg)] 255 lb 9.6 oz (115.939 kg) (02/19 [id number])  Physical Exam:  General: alert and no distress Lochia: appropriate Uterine Fundus: firm Incision: dressing dry DVT Evaluation: No evidence of DVT seen on physical exam.   Recent Labs  06/09/14 2220 06/10/14 0635  HGB 11.8* 10.5*  HCT 34.3* 30.8*    Assessment/Plan: Status post Cesarean section. Doing well postoperatively.  Transfer to Mother- Baby unit . Magnesium sulfate off [name], [name] 06/11/2014, 9:28 AM

## 2014-06-11 NOTE — Progress Notes (Signed)
Clinical Social Work Department PSYCHOSOCIAL ASSESSMENT - MATERNAL/CHILD 06/11/2014  Patient:  LOLITHA, [name]  Account Number:  [id number]  [place] Date:  06/09/2014  Ardine Eng Name:   [name]   Clinical Social Worker:  Lucita Ferrara, CLINICAL SOCIAL WORKER   Date/Time:  06/11/2014 12:30 PM  Date Referred:  06/10/2014   Referral source  Central Nursery     Referred reason  [facility]   Other referral source:    I:  FAMILY / HOME ENVIRONMENT Child's legal guardian:  PARENT  Guardian - Name Yolanda - Age Pratt Melton, [name] [id number]   Other household support members/support persons Name Relationship DOB  [name] [name] 05/31/14  [name] MOTHER    SISTER 66 years old   Other support:   [name] reported that her mother is her primary support. She stated that her mother is helping her with [name] while she is at the hospital.   II  PSYCHOSOCIAL DATA Information Source:  Patient [place] and [employer] Employment:   Currently unemployed.   Financial resources:  Self Pay If Medicaid - County:  [place] Other  [place] / Grade:  N/A Music therapist / Child Services Coordination / Early Interventions:   None reported  Cultural issues impacting care:   None reported   III  STRENGTHS Strengths  Adequate Resources  Home prepared for Child (including basic supplies)  Supportive family/friends   Strength comment:    IV  RISK FACTORS AND CURRENT PROBLEMS Current Problem:  YES   Risk Factor & Current Problem Patient Issue Family Issue Risk Factor / Current Problem Comment  Other - See comment Y N [name] did not receive prenatal care since she did not know that she was pregnant until 2/17.  Other - See comment Y N [name] presents younger than stated age. She was observed to be appropriate with the infant, but may have cognitive limitations.    V  SOCIAL WORK ASSESSMENT CSW met  with the [name] due to no prenatal care during the pregnancy.  [name] was in the AICU while she was waiting for a bed on MBU (CSW waited until the [name]'s magnesium was discontinued). [name] was receptive to the visit and was able to be engaged; however, she had her mother on the phone during the visit.  She displayed a full range in affect and was in a pleasant mood.  She presents with potential cognitive limitations; however, she was able to answer questions appropriately and was interacting appropriately with the infant.   [name] was guided to reflect upon her thoughts and feelings secondary to the birth of "[name]".  She shared that she took a pregnancy test on 2/17 and learned that she was pregnant. She shared that she had an inconsistent menstrual cycle for the past 9 months, and endorsed back pain and feeling "bloated" for the past few months. [name] discussed that due to these symptoms, she decided to take a pregnancy test.  She shared that she was not entirely surprised when she learned that she was pregnant since these symptoms occurred during previous pregnancies; however, she disclosed feelings of "shock" when she arrived to the MAU, learned that she was at [redacted] weeks gestation, and that the baby was going to be delivered.  The [name] shared that since these initial feelings of shock have occurred, she has become excited. The [name] expressed looking forward to having two daughters, and shared  that her 65 year old had a positive experience on 2/18 when she visited. She discussed that her mother and her sister are excited and supportive, and stated that they currently at home preparing for the infant's discharge (she endorsed still having all basic baby supplies).    [name] minimized any feelings of stress secondary to transitioning home.  She stated that she is not overwhelmed with recovering from a C-section, caring for a 23 year old with special needs, and adjusting to a newborn.  She does not appear to be fully understanding  the significant stress that can accompany these events even when CSW attempted to normalize feelings.  She did state that she is concerned since her mother will be going to work this weekend, and she is not sure the level of support she will have at home once she is discharged, but she did not dwell on this stress.  [name] denied mental health history and history of postpartum depression.  She shared that she could not understand how people could experience postpartum depression since "it's exciting" to have a baby.    CSW shared hospital drug screen policy due to [facility].  [name] verbalized understanding and adamantly denied any substance use during the pregnancy. She also denied prior history of substance abuse.  These reports conflict medical records, as it was reported that she consumed etoh during this pregnancy and has a history of drug use.   The infant's UDS is positive for benzodiazepines; however, CSW consulted with pharmacist who reported that the [name] was given a benzodiazepine prior to delivery to assist her with placement of the epidural.  [name] verbalized understanding that a CPS report will be made if the infant's MDS is positive for unprescribed substances.   [name] denied additional questions, concerns, or needs at this time.  She acknowledged ongoing CSW availability as needed while at the hospital.  The [name] also requested CSW business card in the event that she has questions, CSW provided.   VI SOCIAL WORK PLAN Social Work Secretary/administrator Education  Information/Referral to [employer]  No Further Intervention Required / No Barriers to Discharge   Type of pt/family education:   Hospital drug screen policy  Postpartum depression   If child protective services report - county:  N/A If child protective services report - date:  N/A Information/referral to community resources comment:   Retail banker   Other social work plan:   CSW contacted [employer] confirmed  that there is no open CPS case.  CSW will follow up with [name] as needed or upon family request.  CSW will monitor MDS and will make a CPS report if needed.

## 2014-06-12 MED ORDER — LABETALOL HCL 5 MG/ML IV SOLN
5.0000 mg | INTRAVENOUS | Status: DC | PRN
  Administered 2014-06-12: 5 mg via INTRAVENOUS

## 2014-06-12 MED ORDER — AMLODIPINE BESYLATE 10 MG PO TABS
10.0000 mg | ORAL_TABLET | Freq: Every day | ORAL | Status: DC
  Administered 2014-06-12: 10 mg via ORAL
  Filled 2014-06-12 (×2): qty 1

## 2014-06-12 MED ORDER — IBUPROFEN 600 MG PO TABS: 600.0000 mg | ORAL_TABLET | Freq: Four times a day (QID) | ORAL | Status: AC

## 2014-06-12 MED ORDER — AMLODIPINE BESYLATE 10 MG PO TABS: 10.0000 mg | ORAL_TABLET | Freq: Every day | ORAL | Status: AC

## 2014-06-12 MED ORDER — OXYCODONE-ACETAMINOPHEN 5-325 MG PO TABS: 1.0000 | ORAL_TABLET | Freq: Four times a day (QID) | ORAL | Status: AC | PRN

## 2014-06-12 MED ORDER — TRIAMTERENE-HCTZ 37.5-25 MG PO TABS
1.0000 | ORAL_TABLET | Freq: Every day | ORAL | Status: DC
  Administered 2014-06-12: 1 via ORAL
  Filled 2014-06-12 (×2): qty 1

## 2014-06-12 MED ORDER — TRIAMTERENE-HCTZ 37.5-25 MG PO TABS: 1.0000 | ORAL_TABLET | Freq: Every day | ORAL | Status: AC

## 2014-06-12 NOTE — Discharge Instructions (Signed)

## 2014-06-12 NOTE — Progress Notes (Signed)
Subjective: Postpartum Day 2: Cesarean Delivery Patient reports incisional pain, tolerating PO, + flatus, + BM and no problems voiding.    Objective: Vital signs in last 24 hours: Temp:  [97.9 F (36.6 C)-99.1 F (37.3 C)] 98.2 F (36.8 C) (02/20 0556) Pulse Rate:  [88-117] 112 (02/20 0556) Resp:  [16-18] 18 (02/20 0556) BP: (139-192)/(75-114) 158/95 mmHg (02/20 0556) SpO2:  [100 %] 100 % (02/20 0556)  Physical Exam:  General: alert, cooperative and no distress Lochia: appropriate Uterine Fundus: firm Incision: no significant drainage, no significant erythema DVT Evaluation: No evidence of DVT seen on physical exam. No cords or calf tenderness. No significant calf/ankle edema.   Recent Labs  06/09/14 2220 06/10/14 0635  HGB 11.8* 10.5*  HCT 34.3* 30.8*    Assessment/Plan: Status post Cesarean section. Postoperative course complicated by pre-eclampsia. Started on maxzide and norvasc, last prn labetalol at 4am. Asymptomatic. Needs to go home today to care for her older daughter who is disabled. Will re-evaluate pressures this afternoon and plan for discharge this evening if possible.    [name], [name] 06/12/2014, 7:44 AM

## 2014-06-12 NOTE — Discharge Summary (Signed)
Obstetric Discharge Summary Reason for Admission: severe preeclampsia at term without prenatal care Prenatal Procedures: none Intrapartum Procedures: cesarean: low cervical, transverse Postpartum Procedures: magnesium sulfate Complications-Operative and Postpartum: none HEMOGLOBIN  Date Value Ref Range Status  06/10/2014 10.5* 12.0 - 15.0 g/dL Final   HCT  Date Value Ref Range Status  06/10/2014 30.8* 36.0 - 46.0 % Final    Physical Exam:  General: alert, cooperative and no distress Lochia: appropriate Uterine Fundus: soft, firm Incision: no significant drainage, no erythema, no induration DVT Evaluation: Negative Homan's sign. No cords or calf tenderness.  Discharge Diagnoses: Term Pregnancy-delivered and Preelampsia  Discharge Information: Date: 06/12/2014 Activity: pelvic rest Diet: routine Medications: PNV, Ibuprofen, Percocet and norvasc and maxzide Condition: stable Instructions: refer to practice specific booklet Discharge to: home Follow-up Information    [facility] Schedule an appointment as soon as possible for a visit in 2 weeks.   Contact information:   [address] [place] [id number] [phone]      Newborn Data: Live born female  Birth Weight: 5 lb 15.8 oz (2715 g) APGAR: 8, 9  Home with mother.  [name] 06/12/2014, 2:11 PM

## 2014-06-13 LAB — TYPE AND SCREEN
ABO/RH(D): A POS
ANTIBODY SCREEN: NEGATIVE
UNIT DIVISION: 0
Unit division: 0

## 2014-07-12 ENCOUNTER — Ambulatory Visit: Payer: Medicaid Other | Admitting: Obstetrics & Gynecology

## 2014-12-23 IMAGING — US US OB LIMITED
1 series · 7 of 7 positions shown · non-contrast
Comparison: none

CLINICAL DATA: Bradycardia.  No prenatal care.

[Series 1: us ob comp +14 wk · 7 of 7 slices shown]
[im 1/7]
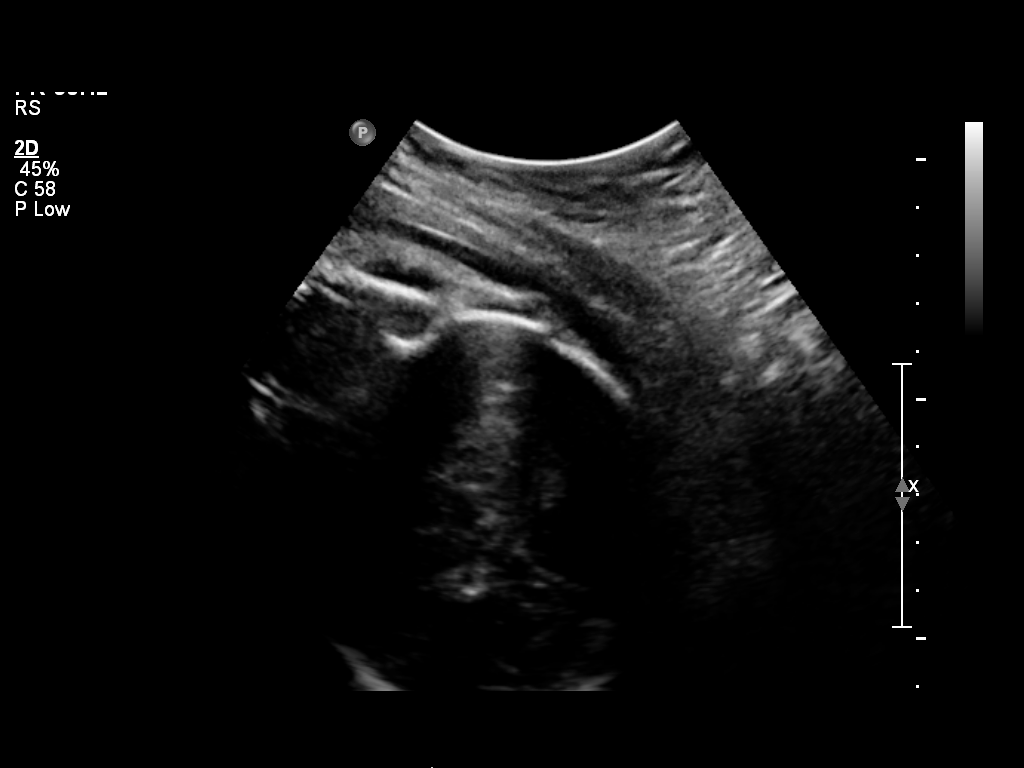
[im 2/7]
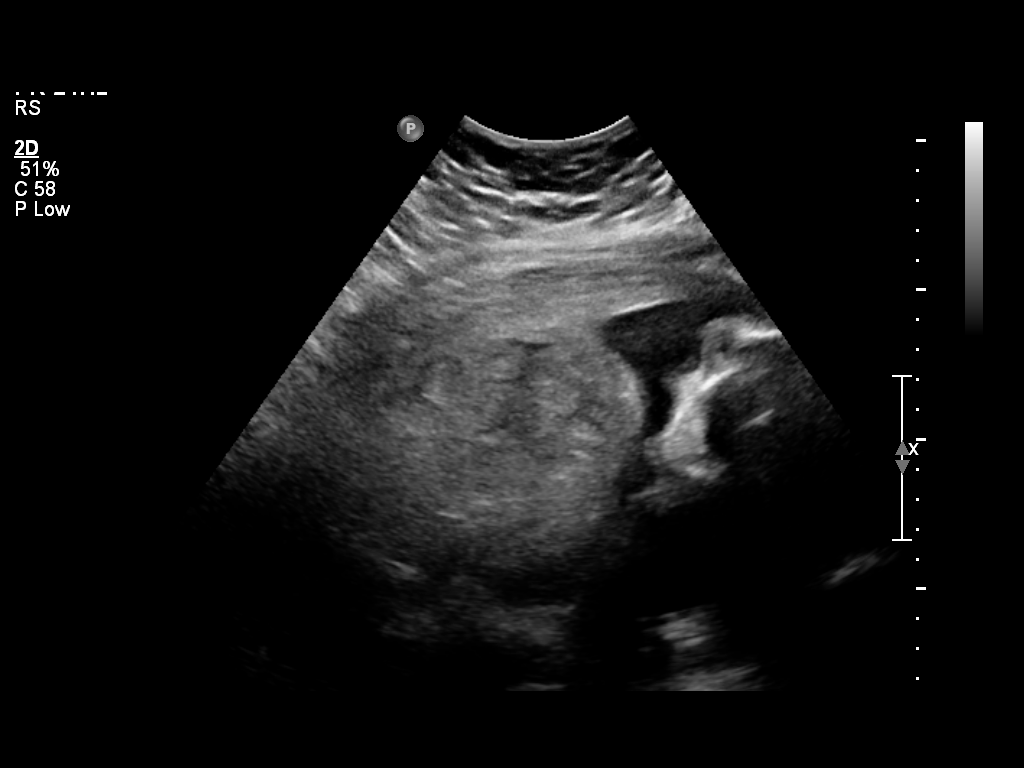
[im 3/7]
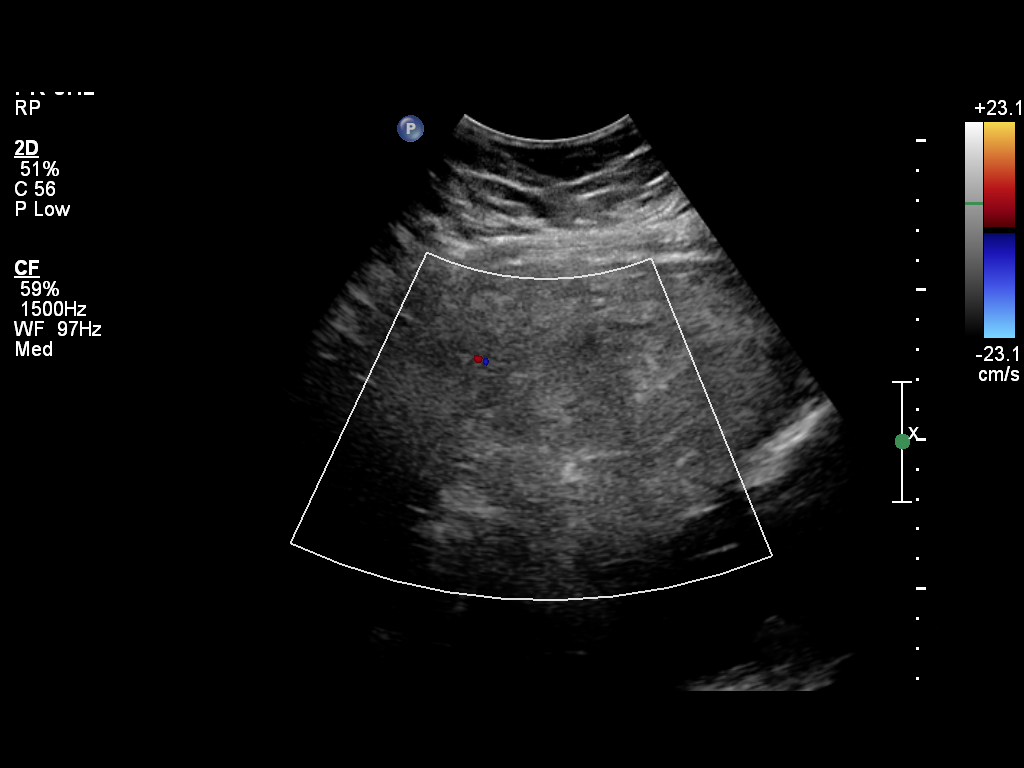
[im 4/7]
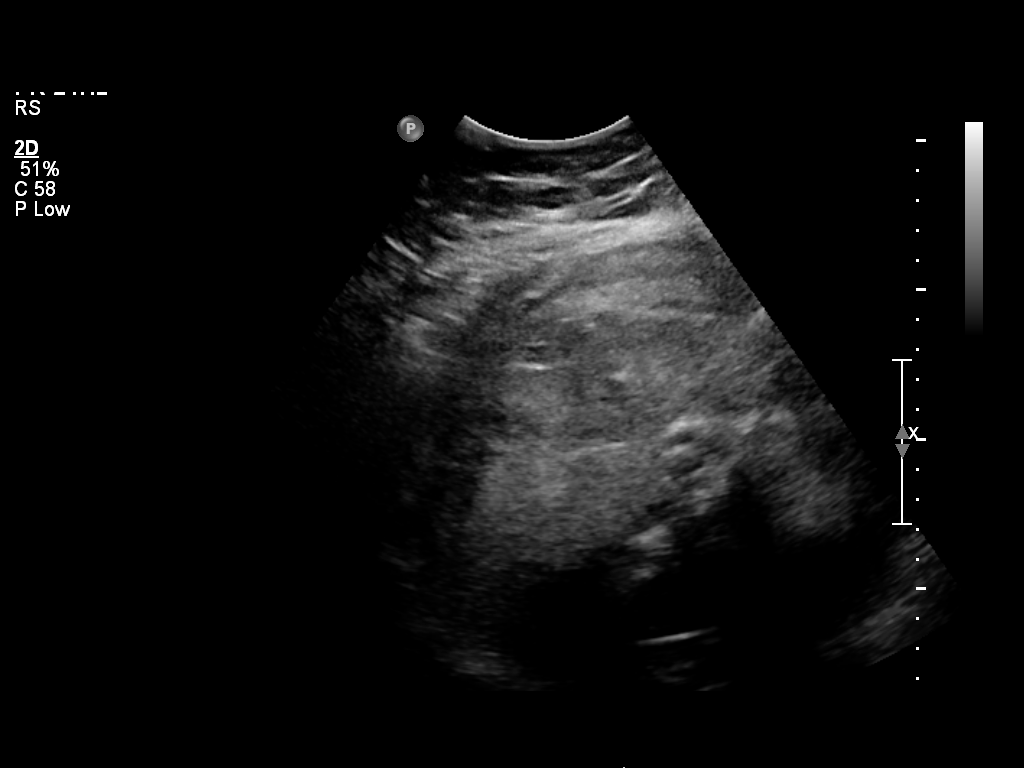
[im 5/7]
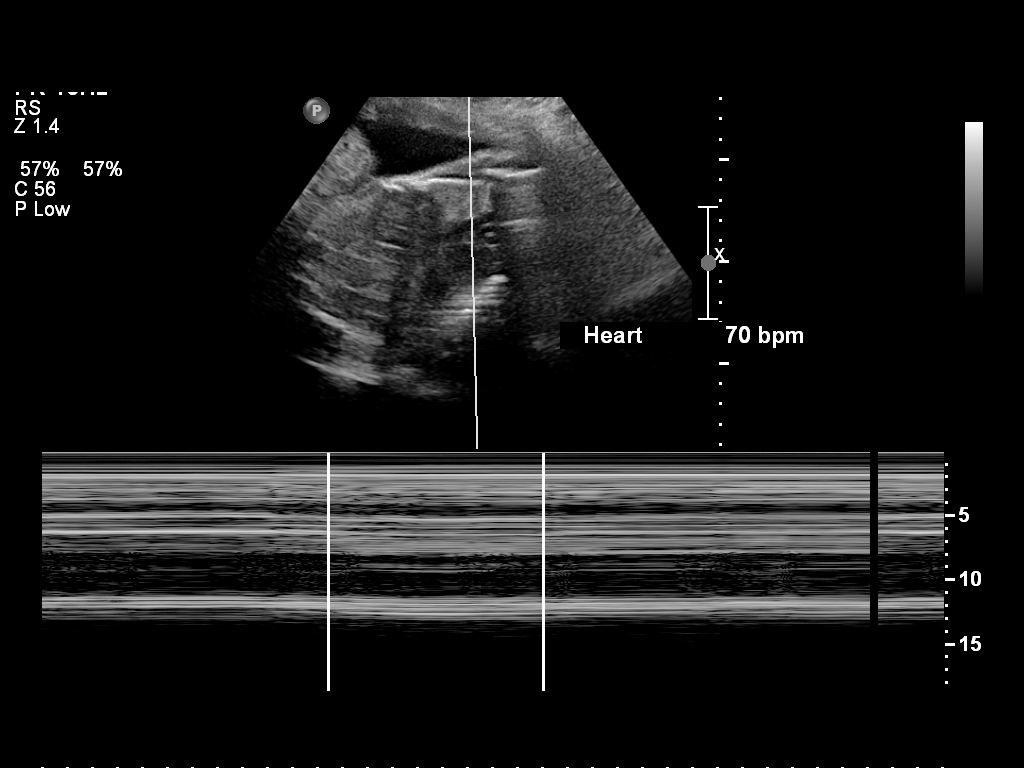
[im 6/7]
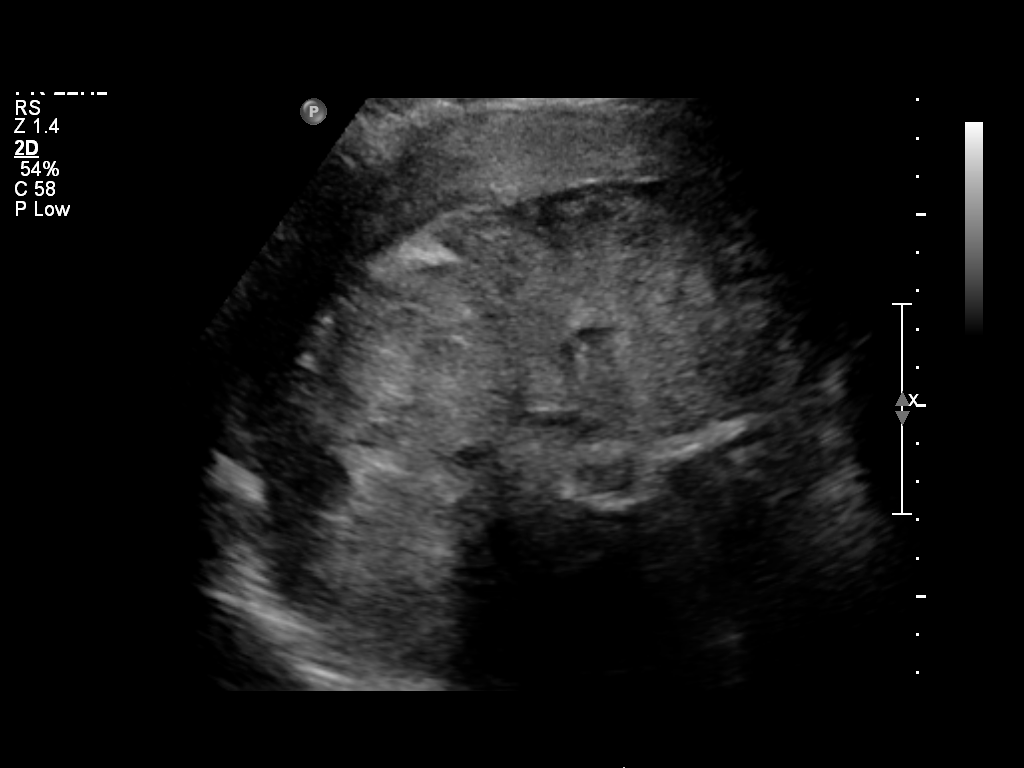
[im 7/7]
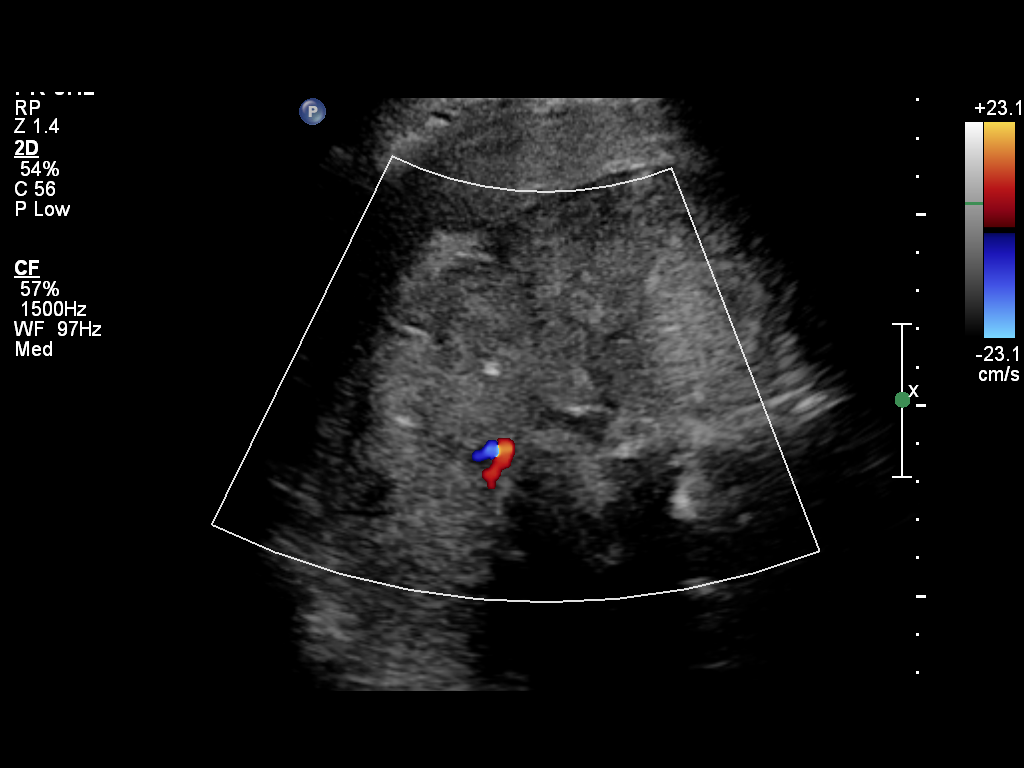

[7 of 7 positions shown; findings below may reference images not displayed]

LIMITED OBSTETRIC ULTRASOUND

Number of Fetuses: 1
Heart Rate: 70 bpm
Movement: Minimal
Presentation: Cephalic
Placental Location: Anterior and fundal.  The placenta is markedly
heterogeneous.
Previa: None

Given the low heart rate to, the study was terminated per the
attending obstetrician, Dr. Bent.
IMPRESSION: 1.  Marked bradycardia.
2.  The study was terminated to take the patient to the OR for
delivery at the request of the attending obstetrician, Dr. Bent
